# Patient Record
Sex: Female | Born: 1965
Health system: Southern US, Community
[De-identification: ages and names within clinical notes are randomized; demographics above are authoritative.]

## PROBLEM LIST (undated history)

## (undated) DIAGNOSIS — D259 Leiomyoma of uterus, unspecified: Secondary | ICD-10-CM

## (undated) DIAGNOSIS — S199XXA Unspecified injury of neck, initial encounter: Secondary | ICD-10-CM

## (undated) DIAGNOSIS — S129XXA Fracture of neck, unspecified, initial encounter: Secondary | ICD-10-CM

## (undated) DIAGNOSIS — I10 Essential (primary) hypertension: Secondary | ICD-10-CM

## (undated) DIAGNOSIS — IMO0001 Reserved for inherently not codable concepts without codable children: Secondary | ICD-10-CM

## (undated) DIAGNOSIS — D500 Iron deficiency anemia secondary to blood loss (chronic): Secondary | ICD-10-CM

## (undated) HISTORY — DX: Leiomyoma of uterus, unspecified: D25.9

## (undated) HISTORY — DX: Reserved for inherently not codable concepts without codable children: IMO0001

## (undated) HISTORY — PX: UTERINE FIBROID SURGERY: SHX826

## (undated) HISTORY — DX: Essential (primary) hypertension: I10

## (undated) HISTORY — DX: Unspecified injury of neck, initial encounter: S19.9XXA

## (undated) HISTORY — DX: Fracture of neck, unspecified, initial encounter: S12.9XXA

## (undated) HISTORY — DX: Iron deficiency anemia secondary to blood loss (chronic): D50.0

---

## 2010-01-07 ENCOUNTER — Other Ambulatory Visit: Admission: RE | Admit: 2010-01-07 | Discharge: 2010-01-07 | Payer: Self-pay | Admitting: Obstetrics and Gynecology

## 2010-02-27 ENCOUNTER — Encounter: Admission: RE | Admit: 2010-02-27 | Discharge: 2010-02-27 | Payer: Self-pay | Admitting: Family Medicine

## 2011-01-30 ENCOUNTER — Other Ambulatory Visit: Payer: Self-pay | Admitting: Family Medicine

## 2011-01-30 DIAGNOSIS — Z1231 Encounter for screening mammogram for malignant neoplasm of breast: Secondary | ICD-10-CM

## 2011-03-03 ENCOUNTER — Ambulatory Visit
Admission: RE | Admit: 2011-03-03 | Discharge: 2011-03-03 | Disposition: A | Discharge status: Home / Self Care | Payer: BC Managed Care – PPO | Source: Ambulatory Visit | Attending: Family Medicine | Admitting: Family Medicine

## 2011-03-03 DIAGNOSIS — Z1231 Encounter for screening mammogram for malignant neoplasm of breast: Secondary | ICD-10-CM

## 2011-05-31 ENCOUNTER — Emergency Department (HOSPITAL_COMMUNITY): Payer: BC Managed Care – PPO

## 2011-05-31 ENCOUNTER — Encounter (HOSPITAL_COMMUNITY): Payer: Self-pay | Admitting: *Deleted

## 2011-05-31 ENCOUNTER — Emergency Department (HOSPITAL_COMMUNITY)
Admission: EM | Admit: 2011-05-31 | Discharge: 2011-05-31 | Disposition: A | Discharge status: Home / Self Care | Payer: Self-pay | Attending: Emergency Medicine | Admitting: Emergency Medicine

## 2011-05-31 ENCOUNTER — Inpatient Hospital Stay (HOSPITAL_COMMUNITY)
Admission: EM | Admit: 2011-05-31 | Discharge: 2011-06-05 | DRG: 865 | Disposition: A | Discharge status: Home Health | Payer: BC Managed Care – PPO | Attending: Neurosurgery | Admitting: Neurosurgery

## 2011-05-31 DIAGNOSIS — S12600A Unspecified displaced fracture of seventh cervical vertebra, initial encounter for closed fracture: Secondary | ICD-10-CM

## 2011-05-31 DIAGNOSIS — S01501A Unspecified open wound of lip, initial encounter: Secondary | ICD-10-CM | POA: Insufficient documentation

## 2011-05-31 DIAGNOSIS — W010XXA Fall on same level from slipping, tripping and stumbling without subsequent striking against object, initial encounter: Secondary | ICD-10-CM | POA: Diagnosis present

## 2011-05-31 DIAGNOSIS — S129XXA Fracture of neck, unspecified, initial encounter: Secondary | ICD-10-CM

## 2011-05-31 DIAGNOSIS — W19XXXA Unspecified fall, initial encounter: Secondary | ICD-10-CM | POA: Insufficient documentation

## 2011-05-31 DIAGNOSIS — Y9301 Activity, walking, marching and hiking: Secondary | ICD-10-CM

## 2011-05-31 DIAGNOSIS — I10 Essential (primary) hypertension: Secondary | ICD-10-CM | POA: Diagnosis present

## 2011-05-31 DIAGNOSIS — S12500A Unspecified displaced fracture of sixth cervical vertebra, initial encounter for closed fracture: Principal | ICD-10-CM | POA: Diagnosis present

## 2011-05-31 DIAGNOSIS — Y9229 Other specified public building as the place of occurrence of the external cause: Secondary | ICD-10-CM

## 2011-05-31 DIAGNOSIS — M25519 Pain in unspecified shoulder: Secondary | ICD-10-CM | POA: Insufficient documentation

## 2011-05-31 HISTORY — DX: Fracture of neck, unspecified, initial encounter: S12.9XXA

## 2011-05-31 LAB — POCT I-STAT, CHEM 8
Calcium, Ion: 1.15 mmol/L (ref 1.12–1.32)
Creatinine, Ser: 0.8 mg/dL (ref 0.50–1.10)
Glucose, Bld: 94 mg/dL (ref 70–99)
HCT: 44 % (ref 36.0–46.0)
Hemoglobin: 15 g/dL (ref 12.0–15.0)

## 2011-05-31 LAB — CBC
HCT: 41 % (ref 36.0–46.0)
Hemoglobin: 14 g/dL (ref 12.0–15.0)
MCH: 28.7 pg (ref 26.0–34.0)
MCHC: 34.1 g/dL (ref 30.0–36.0)
MCV: 84 fL (ref 78.0–100.0)
RBC: 4.88 MIL/uL (ref 3.87–5.11)

## 2011-05-31 LAB — DIFFERENTIAL
Basophils Relative: 0 % (ref 0–1)
Eosinophils Absolute: 0.1 10*3/uL (ref 0.0–0.7)
Lymphs Abs: 1.2 10*3/uL (ref 0.7–4.0)
Monocytes Absolute: 0.3 10*3/uL (ref 0.1–1.0)
Monocytes Relative: 4 % (ref 3–12)

## 2011-05-31 LAB — MRSA PCR SCREENING: MRSA by PCR: NEGATIVE

## 2011-05-31 MED ORDER — ACETAMINOPHEN 325 MG PO TABS
650.0000 mg | ORAL_TABLET | Freq: Four times a day (QID) | ORAL | Status: DC | PRN
  Filled 2011-05-31: qty 2

## 2011-05-31 MED ORDER — MORPHINE SULFATE 2 MG/ML IJ SOLN
2.0000 mg | INTRAMUSCULAR | Status: DC | PRN
  Administered 2011-05-31 – 2011-06-01 (×4): 2 mg via INTRAVENOUS
  Filled 2011-05-31 (×4): qty 1

## 2011-05-31 MED ORDER — POTASSIUM CHLORIDE IN NACL 20-0.9 MEQ/L-% IV SOLN
INTRAVENOUS | Status: DC
  Administered 2011-05-31 – 2011-06-02 (×4): via INTRAVENOUS
  Filled 2011-05-31 (×8): qty 1000

## 2011-05-31 MED ORDER — HYDROMORPHONE HCL PF 1 MG/ML IJ SOLN
1.0000 mg | Freq: Once | INTRAMUSCULAR | Status: AC
  Administered 2011-05-31: 1 mg via INTRAMUSCULAR
  Filled 2011-05-31: qty 1

## 2011-05-31 MED ORDER — HYDROMORPHONE HCL PF 1 MG/ML IJ SOLN
1.0000 mg | INTRAMUSCULAR | Status: DC | PRN
  Administered 2011-05-31 – 2011-06-01 (×3): 1 mg via INTRAVENOUS
  Filled 2011-05-31 (×3): qty 1

## 2011-05-31 MED ORDER — ONDANSETRON HCL 4 MG/2ML IJ SOLN
4.0000 mg | Freq: Three times a day (TID) | INTRAMUSCULAR | Status: DC | PRN
  Administered 2011-05-31: 4 mg via INTRAVENOUS
  Filled 2011-05-31: qty 2

## 2011-05-31 MED ORDER — CEFAZOLIN SODIUM 1-5 GM-% IV SOLN
1.0000 g | Freq: Once | INTRAVENOUS | Status: DC
  Filled 2011-05-31 (×3): qty 50

## 2011-05-31 MED ORDER — ACETAMINOPHEN 650 MG RE SUPP: 650.0000 mg | Freq: Four times a day (QID) | RECTAL | Status: DC | PRN

## 2011-05-31 MED ORDER — ONDANSETRON HCL 4 MG/2ML IJ SOLN: 4.0000 mg | Freq: Four times a day (QID) | INTRAMUSCULAR | Status: DC | PRN

## 2011-05-31 MED ORDER — HYDROCODONE-ACETAMINOPHEN 5-325 MG PO TABS
1.0000 | ORAL_TABLET | Freq: Once | ORAL | Status: AC
  Administered 2011-05-31: 1 via ORAL
  Filled 2011-05-31: qty 1

## 2011-05-31 MED ORDER — ZOLPIDEM TARTRATE 5 MG PO TABS
5.0000 mg | ORAL_TABLET | Freq: Every evening | ORAL | Status: DC | PRN
  Administered 2011-05-31 – 2011-06-03 (×2): 5 mg via ORAL
  Filled 2011-05-31 (×2): qty 1

## 2011-05-31 NOTE — ED Notes (Signed)
Patient tripped and fell onto the concrete and rolled.  Patient c/o right hand, left hand, right shoulder pain, mostly on the right hand.  Patient has laceration to her bottom lip.  Denies LOC.  Alert and oriented x 3. Was able to bear weight on both legs

## 2011-05-31 NOTE — ED Notes (Signed)
Patient back from radiology.

## 2011-05-31 NOTE — ED Notes (Signed)
Bed:WA18<BR> Expected date:<BR> Expected time:<BR> Means of arrival:<BR> Comments:<BR>

## 2011-05-31 NOTE — ED Notes (Signed)
MD at bedside. 

## 2011-05-31 NOTE — H&P (Signed)
BP 143/80  Pulse 88  Temp(Src) 99.7 F (37.6 C) (Oral)  Resp 18  Ht 5\' 10"  (1.778 m)  Wt 114.9 kg (253 lb 4.9 oz)  BMI 36.35 kg/m2  SpO2 96%  LMP 05/26/2011 CC: Cervical Fracture HPI:46 yo woman whom while leaving a laundromat today tripped and fell headfirst into the side of her car then the ground. She felt pain bilaterally in her hands and arms.She denied a loss of consciousness. She was brought to the [facility] ER for evaluation. I received a report that she was neurologically normal, except for her hand pain. Mrs. Rhine underwent a cervical spine CT which revealed a jumped facet on the right at C6/7. There is significant foraminal compromise on the right at C6/7 as a result of the fracture of the C7 facet on the right.  History reviewed. No pertinent past medical history. History reviewed. No pertinent past surgical history. No Known Allergies History reviewed. No pertinent family history. History   Social History  . Marital Status: Married    Spouse Name: N/A    Number of Children: N/A  . Years of Education: N/A   Occupational History  . Not on file.   Social History Main Topics  . Smoking status: Never Smoker   . Smokeless tobacco: Not on file  . Alcohol Use: No  . Drug Use:   . Sexually Active:    Other Topics Concern  . Not on file   Social History Narrative  . No narrative on file   Prior to Admission medications   Not on File   Physical exam: Mrs. Akre is alert, oriented x 4, speech is clear and fluent. Peerl, full eom. Symmetric facies, symmetric facial sensation. Hearing intact to finger rub. Uvula elevates in the midline, shoulder shrug is normal, tongue protrudes in the midline. 5/5 strength in the deltoids, biceps, Ltriceps,L wrist extensors, intrinsics, lower extremities bilaterally. R triceps 4/5, wrist extensors 4/5, intrinsics 5/5 Normal muscle tone, bulk, and coordination. No clonus, no hoffman's. Toes down going to plantar  stimulation. Propioception nml Very tender to touch in both hands No cervical masses or bruits Lungs clear, heart regular rhythm and rate abd soft not tender, +bowel sounds No clubbing cyanosis, or edema in the extremities. A/P OR tomorrow for ORIF of jumped facet at C6/7. I would expect it to help with the triceps strength on the right. The hand pain seems dysethetic will try neurontin post op. Risks and benefits including paralysis, infection, bleeding, no improvement, weakness, bowel/bladder dysfunction,fusion failure, hardware failure,need for future surgery were discussed. I have recommended the surgery, and she has agreed.

## 2011-05-31 NOTE — ED Notes (Signed)
Patient transferred to [facility] via [facility]

## 2011-05-31 NOTE — ED Notes (Signed)
Patient fell while walking and states that she hit her head on the car then fell onto the floor.  Denies LOC.  C/o bilateral arm pain, has a small laceration to her lower lip (no active bleeding at this time), abrasions to her feet.  Alert and oriented x 3.  Brought by [employer]

## 2011-05-31 NOTE — ED Provider Notes (Signed)
History     CSN: [id number]  Arrival date & time 05/31/11  1137   First MD Initiated Contact with Patient 05/31/11 1215      1:03 PM HPI Patient  reports she tripped over a bump in the road. States she fell and hit her head. Initially was having head pain and neck pain but now reports those have improved and is having bilateral hand pain patient is supple laceration to equal mucosa of lower lip. Denies loss of consciousness, chest pain, abdominal pain, shortness of breath. Discussed with patient and states patient is behaving normally. Patient does not recall how she injured her hands.  Patient is a 46 y.o. female presenting with fall. The history is provided by the patient.  Fall The fall occurred while walking. She landed on concrete. The volume of blood lost was minimal. The point of impact was the head. The pain is present in the head (neck, and bilateral hands). The pain is at a severity of 9/10. The pain is moderate. There was no entrapment after the fall. There was no alcohol use involved in the accident. Associated symptoms include headaches. Pertinent negatives include no visual change, no numbness, no abdominal pain, no bowel incontinence, no nausea, no vomiting, no loss of consciousness and no tingling. Treatment on scene includes a c-collar and a backboard.    History reviewed. No pertinent past medical history.  History reviewed. No pertinent past surgical history.  History reviewed. No pertinent family history.  History  Substance Use Topics  . Smoking status: Never Smoker   . Smokeless tobacco: Not on file  . Alcohol Use: No    OB History    Grav Para Term Preterm Abortions TAB SAB Ect Mult Living                  Review of Systems  HENT: Positive for neck pain.   Gastrointestinal: Negative for nausea, vomiting, abdominal pain and bowel incontinence.  Musculoskeletal:       Hand pain  Skin: Positive for wound.  Neurological: Positive for headaches. Negative for  tingling, loss of consciousness and numbness.  All other systems reviewed and are negative.    Allergies  Review of patient's allergies indicates no known allergies.  Home Medications  No current outpatient prescriptions on file.  BP 138/78  Pulse 77  Temp(Src) 98.4 F (36.9 C) (Oral)  Resp 18  SpO2 100%  LMP 05/26/2011  Physical Exam  Vitals reviewed. Constitutional: She is oriented to person, place, and time. Vital signs are normal. She appears well-developed and well-nourished.  HENT:  Head: Normocephalic and atraumatic.  Eyes: Conjunctivae are normal. Pupils are equal, round, and reactive to light.  Neck: Normal range of motion. Neck supple.  Cardiovascular: Normal rate, regular rhythm and normal heart sounds.  Exam reveals no friction rub.   No murmur heard. Pulmonary/Chest: Effort normal and breath sounds normal. She has no wheezes. She has no rhonchi. She has no rales. She exhibits no tenderness.  Musculoskeletal:       Cervical back: She exhibits decreased range of motion (immobilized in C-collar ). She exhibits no bony tenderness and normal pulse.       Thoracic back: Normal.       Lumbar back: Normal.       Patient has full range of motion bilaterally in hands. Tender to palpation over dorsal portion of the hands. Normal capillary refill. Normal sensation. Decreased range of motion. Patient has muscle tenderness and cramping. Decreased range  of motion but patient states this is due to severe pain in hands.  Neurological: She is alert and oriented to person, place, and time. Coordination normal.  Skin: Skin is warm and dry. No rash noted. No erythema. No pallor.    ED Course  Procedures   No results found for this or any previous visit. Ct Head Wo Contrast  05/31/2011  *RADIOLOGY REPORT*  Clinical Data:  Status post fall with head injury and facial laceration.  CT HEAD WITHOUT CONTRAST CT CERVICAL SPINE WITHOUT CONTRAST  Technique:  Multidetector CT imaging of the  head and cervical spine was performed following the standard protocol without intravenous contrast.  Multiplanar CT image reconstructions of the cervical spine were also generated.  Comparison:   None  CT HEAD  Findings: There is no evidence of acute intracranial hemorrhage, mass lesion, brain edema or extra-axial fluid collection.  The ventricles and subarachnoid spaces are appropriately sized for age. There is no CT evidence of acute cortical infarction.  The visualized paranasal sinuses are clear. The calvarium is intact.  There may be mild scalp swelling at the left parietal vertex.  IMPRESSION: No acute intracranial or calvarial findings.  CT CERVICAL SPINE  Findings: There is a mildly displaced intra-articular fracture involving the right C7 articulating facet.  This extends into the right C6-C7 facet joint and results in moderate right foraminal stenosis at C6-C7.  There is 3 mm of anterior subluxation of C6 on C7 with anterior disc space narrowing.  The left facet joint appears mildly widened but intact.  No other fractures are identified.  There is reversal of the usual cervical lordosis with mild multilevel spondylosis.  IMPRESSION:  1.  Intra-articular mildly displaced fracture of the right C7 articulating facet is associated with mild anterolisthesis at C6-C7 and mild widening of the left facet joint.  This may represent an unstable injury. 2.  No other acute osseous findings. 3.  Mild multilevel spondylosis.  Critical Value/emergent results were called by telephone at the time of interpretation on 05/31/2011  at 1315 hours  to  Thomasene Lot, who verbally acknowledged these results.  Original Report Authenticated By: Gerrianne Scale, M.D.   Ct Cervical Spine Wo Contrast  05/31/2011  *RADIOLOGY REPORT*  Clinical Data:  Status post fall with head injury and facial laceration.  CT HEAD WITHOUT CONTRAST CT CERVICAL SPINE WITHOUT CONTRAST  Technique:  Multidetector CT imaging of the head and cervical  spine was performed following the standard protocol without intravenous contrast.  Multiplanar CT image reconstructions of the cervical spine were also generated.  Comparison:   None  CT HEAD  Findings: There is no evidence of acute intracranial hemorrhage, mass lesion, brain edema or extra-axial fluid collection.  The ventricles and subarachnoid spaces are appropriately sized for age. There is no CT evidence of acute cortical infarction.  The visualized paranasal sinuses are clear. The calvarium is intact.  There may be mild scalp swelling at the left parietal vertex.  IMPRESSION: No acute intracranial or calvarial findings.  CT CERVICAL SPINE  Findings: There is a mildly displaced intra-articular fracture involving the right C7 articulating facet.  This extends into the right C6-C7 facet joint and results in moderate right foraminal stenosis at C6-C7.  There is 3 mm of anterior subluxation of C6 on C7 with anterior disc space narrowing.  The left facet joint appears mildly widened but intact.  No other fractures are identified.  There is reversal of the usual cervical lordosis with mild multilevel  spondylosis.  IMPRESSION:  1.  Intra-articular mildly displaced fracture of the right C7 articulating facet is associated with mild anterolisthesis at C6-C7 and mild widening of the left facet joint.  This may represent an unstable injury. 2.  No other acute osseous findings. 3.  Mild multilevel spondylosis.  Critical Value/emergent results were called by telephone at the time of interpretation on 05/31/2011  at 1315 hours  to  Thomasene Lot, who verbally acknowledged these results.  Original Report Authenticated By: Gerrianne Scale, M.D.     MDM  Discussed plan with Dr. Jeraldine Loots, who has seen and evaluated the patient with me. Consult with neurosurgery who will admit/Transfer patient to [facility] cone. Family and patient aware of this. Patient placed in hard collar       Thomasene Lot, PA-C 05/31/11  1415

## 2011-06-01 ENCOUNTER — Inpatient Hospital Stay (HOSPITAL_COMMUNITY): Payer: BC Managed Care – PPO

## 2011-06-01 ENCOUNTER — Encounter (HOSPITAL_COMMUNITY): Payer: Self-pay | Admitting: Certified Registered"

## 2011-06-01 ENCOUNTER — Encounter (HOSPITAL_COMMUNITY)
Admission: EM | Disposition: A | Discharge status: Home Health | Payer: Self-pay | Source: Home / Self Care | Attending: Neurosurgery

## 2011-06-01 ENCOUNTER — Inpatient Hospital Stay (HOSPITAL_COMMUNITY): Payer: BC Managed Care – PPO | Admitting: Certified Registered"

## 2011-06-01 HISTORY — PX: POSTERIOR CERVICAL FUSION/FORAMINOTOMY: SHX5038

## 2011-06-01 LAB — URINALYSIS, ROUTINE W REFLEX MICROSCOPIC
Nitrite: NEGATIVE
Specific Gravity, Urine: 1.027 (ref 1.005–1.030)
Urobilinogen, UA: 0.2 mg/dL (ref 0.0–1.0)

## 2011-06-01 LAB — URINE MICROSCOPIC-ADD ON

## 2011-06-01 SURGERY — POSTERIOR CERVICAL FUSION/FORAMINOTOMY LEVEL 1
Anesthesia: General | Site: Spine Cervical | Wound class: Clean

## 2011-06-01 MED ORDER — PROMETHAZINE HCL 25 MG/ML IJ SOLN: 6.2500 mg | INTRAMUSCULAR | Status: DC | PRN

## 2011-06-01 MED ORDER — ACETAMINOPHEN 650 MG RE SUPP: 650.0000 mg | RECTAL | Status: DC | PRN

## 2011-06-01 MED ORDER — OXYCODONE-ACETAMINOPHEN 5-325 MG PO TABS
1.0000 | ORAL_TABLET | ORAL | Status: DC | PRN
Start: 2011-06-01 — End: 2011-06-06
  Administered 2011-06-01: 1 via ORAL
  Administered 2011-06-02: 2 via ORAL
  Administered 2011-06-03: 1 via ORAL
  Administered 2011-06-03 – 2011-06-04 (×4): 2 via ORAL
  Administered 2011-06-04: 1 via ORAL
  Administered 2011-06-04: 2 via ORAL
  Filled 2011-06-01 (×4): qty 2
  Filled 2011-06-01: qty 1
  Filled 2011-06-01 (×3): qty 2

## 2011-06-01 MED ORDER — MORPHINE SULFATE 4 MG/ML IJ SOLN
1.0000 mg | INTRAMUSCULAR | Status: DC | PRN
  Administered 2011-06-01: 2 mg via INTRAVENOUS
  Filled 2011-06-01: qty 1

## 2011-06-01 MED ORDER — 0.9 % SODIUM CHLORIDE (POUR BTL) OPTIME
TOPICAL | Status: DC | PRN
  Administered 2011-06-01: 1000 mL

## 2011-06-01 MED ORDER — SODIUM CHLORIDE 0.9 % IJ SOLN
3.0000 mL | INTRAMUSCULAR | Status: DC | PRN
  Administered 2011-06-01: 3 mL via INTRAVENOUS

## 2011-06-01 MED ORDER — MENTHOL 3 MG MT LOZG: 1.0000 | LOZENGE | OROMUCOSAL | Status: DC | PRN

## 2011-06-01 MED ORDER — ONDANSETRON HCL 4 MG/2ML IJ SOLN: 4.0000 mg | INTRAMUSCULAR | Status: DC | PRN

## 2011-06-01 MED ORDER — BACITRACIN ZINC 500 UNIT/GM EX OINT
TOPICAL_OINTMENT | CUTANEOUS | Status: DC | PRN
  Administered 2011-06-01: 1 via TOPICAL

## 2011-06-01 MED ORDER — THROMBIN 20000 UNITS EX KIT
PACK | CUTANEOUS | Status: DC | PRN
  Administered 2011-06-01: 13:00:00 via TOPICAL

## 2011-06-01 MED ORDER — SODIUM CHLORIDE 0.9 % IJ SOLN
3.0000 mL | Freq: Two times a day (BID) | INTRAMUSCULAR | Status: DC
  Administered 2011-06-01: 22:00:00 via INTRAVENOUS

## 2011-06-01 MED ORDER — DEXAMETHASONE SODIUM PHOSPHATE 4 MG/ML IJ SOLN
4.0000 mg | Freq: Four times a day (QID) | INTRAMUSCULAR | Status: AC
  Administered 2011-06-01 – 2011-06-02 (×3): 4 mg via INTRAVENOUS
  Filled 2011-06-01 (×2): qty 1
  Filled 2011-06-01: qty 2
  Filled 2011-06-01 (×2): qty 1

## 2011-06-01 MED ORDER — LIDOCAINE-EPINEPHRINE 1 %-1:100000 IJ SOLN
INTRAMUSCULAR | Status: DC | PRN
  Administered 2011-06-01: 8 mL

## 2011-06-01 MED ORDER — HYDROMORPHONE HCL PF 1 MG/ML IJ SOLN: 0.2500 mg | INTRAMUSCULAR | Status: DC | PRN

## 2011-06-01 MED ORDER — CEFAZOLIN SODIUM 1-5 GM-% IV SOLN
1.0000 g | Freq: Three times a day (TID) | INTRAVENOUS | Status: AC
  Administered 2011-06-01 (×2): 1 g via INTRAVENOUS
  Filled 2011-06-01 (×2): qty 50

## 2011-06-01 MED ORDER — SODIUM CHLORIDE 0.9 % IV SOLN: 250.0000 mL | INTRAVENOUS | Status: DC

## 2011-06-01 MED ORDER — HEMOSTATIC AGENTS (NO CHARGE) OPTIME: TOPICAL | Status: DC | PRN

## 2011-06-01 MED ORDER — CYCLOBENZAPRINE HCL 10 MG PO TABS
10.0000 mg | ORAL_TABLET | Freq: Three times a day (TID) | ORAL | Status: DC | PRN
  Administered 2011-06-02 – 2011-06-04 (×5): 10 mg via ORAL
  Filled 2011-06-01 (×6): qty 1

## 2011-06-01 MED ORDER — SENNA 8.6 MG PO TABS
1.0000 | ORAL_TABLET | Freq: Two times a day (BID) | ORAL | Status: DC
  Administered 2011-06-01 – 2011-06-04 (×7): 8.6 mg via ORAL
  Filled 2011-06-01 (×10): qty 1

## 2011-06-01 MED ORDER — HYDROCODONE-ACETAMINOPHEN 5-325 MG PO TABS
1.0000 | ORAL_TABLET | ORAL | Status: DC | PRN
  Administered 2011-06-01: 1 via ORAL
  Administered 2011-06-02 – 2011-06-04 (×2): 2 via ORAL
  Filled 2011-06-01: qty 1
  Filled 2011-06-01 (×2): qty 2

## 2011-06-01 MED ORDER — PHENOL 1.4 % MT LIQD: 1.0000 | OROMUCOSAL | Status: DC | PRN

## 2011-06-01 MED ORDER — DEXAMETHASONE 4 MG PO TABS
4.0000 mg | ORAL_TABLET | Freq: Four times a day (QID) | ORAL | Status: AC
  Administered 2011-06-02 – 2011-06-03 (×3): 4 mg via ORAL
  Filled 2011-06-01 (×3): qty 1

## 2011-06-01 MED ORDER — ACETAMINOPHEN 325 MG PO TABS
650.0000 mg | ORAL_TABLET | ORAL | Status: DC | PRN
  Administered 2011-06-02 (×2): 650 mg via ORAL
  Filled 2011-06-01: qty 2

## 2011-06-01 MED ORDER — ALUM & MAG HYDROXIDE-SIMETH 200-200-20 MG/5ML PO SUSP: 30.0000 mL | Freq: Four times a day (QID) | ORAL | Status: DC | PRN

## 2011-06-01 MED ORDER — MEPERIDINE HCL 25 MG/ML IJ SOLN: 6.2500 mg | INTRAMUSCULAR | Status: DC | PRN

## 2011-06-01 MED ORDER — THROMBIN 5000 UNITS EX KIT: PACK | CUTANEOUS | Status: DC | PRN

## 2011-06-01 SURGICAL SUPPLY — 69 items
BAG DECANTER FOR FLEXI CONT (MISCELLANEOUS) ×2 IMPLANT
BENZOIN TINCTURE PRP APPL 2/3 (GAUZE/BANDAGES/DRESSINGS) IMPLANT
BIT DRILL 2.4X (BIT) ×1 IMPLANT
BIT DRL 2.4X (BIT) ×1
BLADE SURG ROTATE 9660 (MISCELLANEOUS) IMPLANT
BLADE ULTRA TIP 2M (BLADE) IMPLANT
BUR MATCHSTICK NEURO 3.0 LAGG (BURR) ×2 IMPLANT
CABLE SNG STERILE W/CRIMP (Neuro Prosthesis/Implant) ×2 IMPLANT
CANISTER SUCTION 2500CC (MISCELLANEOUS) ×2 IMPLANT
CLOTH BEACON ORANGE TIMEOUT ST (SAFETY) ×2 IMPLANT
CONT SPEC 4OZ CLIKSEAL STRL BL (MISCELLANEOUS) ×2 IMPLANT
DECANTER SPIKE VIAL GLASS SM (MISCELLANEOUS) IMPLANT
DERMABOND ADHESIVE PROPEN (GAUZE/BANDAGES/DRESSINGS) ×2
DERMABOND ADVANCED .7 DNX6 (GAUZE/BANDAGES/DRESSINGS) ×2 IMPLANT
DRAPE C-ARM 42X72 X-RAY (DRAPES) ×4 IMPLANT
DRAPE LAPAROTOMY 100X72 PEDS (DRAPES) ×2 IMPLANT
DRAPE MICROSCOPE LEICA (MISCELLANEOUS) ×2 IMPLANT
DRAPE POUCH INSTRU U-SHP 10X18 (DRAPES) ×2 IMPLANT
DRESSING TELFA 8X3 (GAUZE/BANDAGES/DRESSINGS) IMPLANT
DRILL BIT (BIT) ×1
DURAFORM COLLAGEN 1X1 5-PACK (Neuro Prosthesis/Implant) ×2 IMPLANT
DURAPREP 26ML APPLICATOR (WOUND CARE) ×2 IMPLANT
DURAPREP 6ML APPLICATOR 50/CS (WOUND CARE) IMPLANT
DURASEAL SPINE SEALANT 3ML (MISCELLANEOUS) ×2 IMPLANT
ELECT REM PT RETURN 9FT ADLT (ELECTROSURGICAL) ×2
ELECTRODE REM PT RTRN 9FT ADLT (ELECTROSURGICAL) ×1 IMPLANT
GAUZE SPONGE 4X4 16PLY XRAY LF (GAUZE/BANDAGES/DRESSINGS) IMPLANT
GLOVE BIOGEL PI IND STRL 7.0 (GLOVE) ×2 IMPLANT
GLOVE BIOGEL PI INDICATOR 7.0 (GLOVE) ×2
GLOVE ECLIPSE 6.5 STRL STRAW (GLOVE) ×2 IMPLANT
GLOVE EXAM NITRILE LRG STRL (GLOVE) IMPLANT
GLOVE EXAM NITRILE MD LF STRL (GLOVE) IMPLANT
GLOVE EXAM NITRILE XL STR (GLOVE) IMPLANT
GLOVE EXAM NITRILE XS STR PU (GLOVE) IMPLANT
GLOVE SS BIOGEL STRL SZ 6.5 (GLOVE) ×2 IMPLANT
GLOVE SUPERSENSE BIOGEL SZ 6.5 (GLOVE) ×2
GLOVE SURG SS PI 6.5 STRL IVOR (GLOVE) ×2 IMPLANT
GOWN BRE IMP SLV AUR LG STRL (GOWN DISPOSABLE) ×6 IMPLANT
GOWN BRE IMP SLV AUR XL STRL (GOWN DISPOSABLE) IMPLANT
GOWN STRL REIN 2XL LVL4 (GOWN DISPOSABLE) IMPLANT
KIT BASIN OR (CUSTOM PROCEDURE TRAY) ×2 IMPLANT
KIT ROOM TURNOVER OR (KITS) ×2 IMPLANT
MARKER SKIN DUAL TIP RULER LAB (MISCELLANEOUS) ×2 IMPLANT
NEEDLE HYPO 25X1 1.5 SAFETY (NEEDLE) ×2 IMPLANT
NS IRRIG 1000ML POUR BTL (IV SOLUTION) ×2 IMPLANT
PACK LAMINECTOMY NEURO (CUSTOM PROCEDURE TRAY) ×2 IMPLANT
PAD ARMBOARD 7.5X6 YLW CONV (MISCELLANEOUS) ×6 IMPLANT
PIN MAYFIELD SKULL DISP (PIN) ×2 IMPLANT
ROD T 3.5MM (Rod) ×2 IMPLANT
RUBBERBAND STERILE (MISCELLANEOUS) ×4 IMPLANT
SCREW 3.5X10 (Screw) ×2 IMPLANT
SCREW CANCELLOUS 3.5X14MM (Screw) ×2 IMPLANT
SCREW SET M6 (Screw) ×4 IMPLANT
SPONGE GAUZE 4X4 12PLY (GAUZE/BANDAGES/DRESSINGS) IMPLANT
SPONGE LAP 4X18 X RAY DECT (DISPOSABLE) IMPLANT
SPONGE SURGIFOAM ABS GEL 100 (HEMOSTASIS) ×2 IMPLANT
STAPLER SKIN PROX WIDE 3.9 (STAPLE) ×2 IMPLANT
STRIP CLOSURE SKIN 1/2X4 (GAUZE/BANDAGES/DRESSINGS) IMPLANT
SUT ETHILON 3 0 FSL (SUTURE) IMPLANT
SUT VIC AB 0 CT1 18XCR BRD8 (SUTURE) ×1 IMPLANT
SUT VIC AB 0 CT1 8-18 (SUTURE) ×1
SUT VIC AB 2-0 CP2 18 (SUTURE) ×2 IMPLANT
SUT VIC AB 2-0 CT1 18 (SUTURE) ×2 IMPLANT
SUT VIC AB 3-0 SH 8-18 (SUTURE) ×4 IMPLANT
SYR 20ML ECCENTRIC (SYRINGE) ×2 IMPLANT
TOWEL OR 17X24 6PK STRL BLUE (TOWEL DISPOSABLE) ×2 IMPLANT
TOWEL OR 17X26 10 PK STRL BLUE (TOWEL DISPOSABLE) ×2 IMPLANT
UNDERPAD 30X30 INCONTINENT (UNDERPADS AND DIAPERS) IMPLANT
WATER STERILE IRR 1000ML POUR (IV SOLUTION) ×2 IMPLANT

## 2011-06-01 NOTE — Plan of Care (Signed)
Problem: Phase I Progression Outcomes Goal: Pain controlled with appropriate interventions Outcome: Not Progressing Pt has pain on her hands following an injury after a fall. Pain meds do little at this time but she is scheduled for surgery to manage C7 fracture. Goal: Hemodynamically stable Outcome: Progressing VSS

## 2011-06-01 NOTE — Anesthesia Preprocedure Evaluation (Addendum)
Anesthesia Evaluation  Patient identified by MRN, date of birth, ID band Patient awake    Reviewed: Allergy & Precautions, H&P , NPO status , Patient's Chart, lab work & pertinent test results  Airway Mallampati: II TM Distance: <3 FB Neck ROM: Limited    Dental  (+) Teeth Intact   Pulmonary neg pulmonary ROS,  breath sounds clear to auscultation        Cardiovascular Exercise Tolerance: Good negative cardio ROS  Rhythm:Regular Rate:Normal     Neuro/Psych negative neurological ROS     GI/Hepatic negative GI ROS, Neg liver ROS,   Endo/Other    Renal/GU negative Renal ROS  negative genitourinary   Musculoskeletal negative musculoskeletal ROS (+)   Abdominal (+)  Abdomen: soft.    Peds negative pediatric ROS (+)  Hematology negative hematology ROS (+)   Anesthesia Other Findings   Reproductive/Obstetrics negative OB ROS                         Anesthesia Physical Anesthesia Plan  ASA: I and Emergent  Anesthesia Plan: General   Post-op Pain Management:    Induction: Intravenous  Airway Management Planned: Oral ETT  Additional Equipment:   Intra-op Plan:   Post-operative Plan: Extubation in OR  Informed Consent:   Plan Discussed with: Surgeon and CRNA  Anesthesia Plan Comments:         Anesthesia Quick Evaluation

## 2011-06-01 NOTE — Anesthesia Postprocedure Evaluation (Signed)
  Anesthesia Post-op Note  Patient: Olivia West  Procedure(s) Performed: Procedure(s) (LRB): POSTERIOR CERVICAL FUSION/FORAMINOTOMY LEVEL 1 (N/A)  Patient Location: NICU  Anesthesia Type: General  Level of Consciousness: awake  Airway and Oxygen Therapy: Patient Spontanous Breathing  Post-op Pain: mild  Post-op Assessment: Post-op Vital signs reviewed  Post-op Vital Signs: stable  Complications: No apparent anesthesia complications

## 2011-06-01 NOTE — Progress Notes (Signed)
BP 156/95  Pulse 88  Temp(Src) 99.5 F (37.5 C) (Oral)  Resp 14  Ht 5\' 10"  (1.778 m)  Wt 114.9 kg (253 lb 4.9 oz)  BMI 36.35 kg/m2  SpO2 98%  LMP 05/26/2011 Moving all extremities.  Dressing intact. Doing well

## 2011-06-01 NOTE — Op Note (Signed)
05/31/2011 - 06/01/2011  3:12 PM  PATIENT:  Olivia West  46 y.o. female  PRE-OPERATIVE DIAGNOSIS:  C-6 C-7 Facet Fracture Right C7 radiculopathy POST-OPERATIVE DIAGNOSIS:  Cervical Six-Seven Facet Fracture Right C7 radiculopathy PROCEDURE:  Procedure(s): POSTERIOR CERVICAL FUSION/FORAMINOTOMY LEVEL C6/7, local autograft Posterior instrumentation, Lateral mass screws C6,7 on the Left. Spinous process wiring C6-7 Microdissection  SURGEON:  Surgeon(s): Carmela Hurt, MD  ASSISTANTS:none  ANESTHESIA:   general  EBL:  Total I/O In: 150 [I.V.:150] Out: 1400 [Urine:1400]  BLOOD ADMINISTERED:none  CELL SAVER GIVEN:none  COUNT:per nursing  DRAINS: none   SPECIMEN:  No Specimen  DICTATION: Olivia West presented with a Unilateral jumped facet and associated fracture on the Right at C6/7. She also had weakness in the R C7 distribution, and pain in her hands. I recommended that she undergo an open reduction of the spine with internal fixation. She was brought to the operating room intubated and placed under a general anesthetic. I placed a 3 pin Mayfield headholder to ~70lbs of pressure after adequate anesthesia was obtained. With her cervical collar in place we flipped her in a controlled fashion onto the operating table. After final positioning of her body I secure her head to the bed with the Mayfield adapter.  Her neck was prepped and draped in sterile fashion. Lateral fluoroscopy did not allow me to see the C6/7 level, but AP projections did. I opened the neck with a 10 blade and used monopolar cautery to divide the posterior cervical fascia and expose the lamina of C5,6,and 7. I confirmed my location with xray and the identification of the fracture.  I placed a lateral mass screw at C6 on the Left, and a pedicle screw at C7 on the left, with fluoroscopic guidance. I did not appreciate cut outs of the bone with either screw. I then decompressed the R C7 root. I drilled away some of  the inferior facet of C6 and the superior remaining facet of C7. She had actually reduced the fracture, probably due to positioning initially. With microscopic dissection I removed the fractured portion of the C7 facet which had been driven anteriorly into the foramen. I used the drill, nerve hooks, and kerrison punches to remove the fractured bone. CSF was draining from underneath the lamina. The dura was torn by the fracture. I placed a piece of duragen over the tear then used duraseal .  I placed the songar cable around the spinous processes of C6/7and placed this to about 25lbs of pressure. I then completed the construct by placing a rod from C6 to C7 and locked into position with locking caps. Xray showed the construct in good position. I irrigated the wound then closed in layers. I used vicryl sutures for the subcutaneous layers then staples for the skin edges. I used dermabond for a sterile dressing. I with the team flipped the patient, removed the [facility], then she was extubated.  PLAN OF CARE: Admit to inpatient   PATIENT DISPOSITION:  PACU - hemodynamically stable.   Delay start of Pharmacological VTE agent (>24hrs) due to surgical blood loss or risk of bleeding:  yes

## 2011-06-01 NOTE — ED Provider Notes (Signed)
Medical screening examination/treatment/procedure(s) were conducted as a shared visit with non-physician practitioner(s) and myself.  I personally evaluated the patient during the encounter This previously well female presents following a seemingly innocuous fall with head and neck pain as well as bilateral hand discomfort.  The patient was in no distress on my exam, though she continued to be declared pain in both hands.  The patient's neurologic exam was reassuring.  However, her CT demonstrated an acute facet fracture.  I discussed the case with neurosurgery.  The patient was admitted to their service for further evaluation and management.  Gerhard Munch, MD 06/01/11 [phone]

## 2011-06-01 NOTE — Anesthesia Procedure Notes (Signed)
Procedure Name: Intubation Date/Time: 06/01/2011 11:10 AM Performed by: Ellin Goodie Pre-anesthesia Checklist: Patient identified, Emergency Drugs available, Suction available, Timeout performed and Patient being monitored Patient Re-evaluated:Patient Re-evaluated prior to inductionOxygen Delivery Method: Circle system utilized Preoxygenation: Pre-oxygenation with 100% oxygen Intubation Type: IV induction Ventilation: Mask ventilation without difficulty Laryngoscope Size: Mac and 3 Tube size: 7.5 mm Number of attempts: 1 Airway Equipment and Method: Stylet and Video-laryngoscopy Placement Confirmation: ETT inserted through vocal cords under direct vision,  positive ETCO2 and breath sounds checked- equal and bilateral Secured at: 22 cm Tube secured with: Tape Dental Injury: Teeth and Oropharynx as per pre-operative assessment

## 2011-06-02 ENCOUNTER — Inpatient Hospital Stay (HOSPITAL_COMMUNITY): Payer: BC Managed Care – PPO

## 2011-06-02 ENCOUNTER — Encounter (HOSPITAL_COMMUNITY): Payer: Self-pay | Admitting: Radiology

## 2011-06-02 NOTE — Evaluation (Signed)
Physical Therapy Evaluation Patient Details Name: Olivia West MRN: [id number] DOB: 22-May-1965 Today's Date: 06/02/2011  Problem List:  Patient Active Problem List  Diagnoses  . Fracture of spine, cervical, without spinal cord injury, closed    Past Medical History: History reviewed. No pertinent past medical history. Past Surgical History: History reviewed. No pertinent past surgical history.  PT Assessment/Plan/Recommendation PT Assessment Clinical Impression Statement: Patient presents S/P cervical fusion post traumatic fall. She presents with balance/mobility deficits that will require PTin the acute care setting to ensure that she can safely transition home with decreased burden iof care. PT Recommendation/Assessment: Patient will need skilled PT in the acute care venue PT Problem List: Decreased activity tolerance;Decreased balance;Decreased mobility;Pain;Decreased knowledge of precautions;Decreased knowledge of use of DME PT Therapy Diagnosis : Difficulty walking;Acute pain PT Plan PT Frequency: Min 5X/week PT Treatment/Interventions: DME instruction;Gait training;Stair training;Functional mobility training;Therapeutic activities;Therapeutic exercise;Balance training;Patient/family education PT Recommendation Follow Up Recommendations: Home health PT;Supervision for mobility/OOB Equipment Recommended: Rolling walker with 5" wheels;3 in 1 bedside comode PT Goals  Acute Rehab PT Goals PT Goal Formulation: With patient Time For Goal Achievement: 7 days Pt will Roll Supine to Right Side: with modified independence PT Goal: Rolling Supine to Right Side - Progress: Goal set today Pt will Roll Supine to Left Side: with modified independence PT Goal: Rolling Supine to Left Side - Progress: Goal set today Pt will go Supine/Side to Sit: with modified independence PT Goal: Supine/Side to Sit - Progress: Goal set today Pt will go Sit to Supine/Side: with modified independence PT Goal:  Sit to Supine/Side - Progress: Goal set today Pt will go Sit to Stand: with modified independence;without upper extremity assist PT Goal: Sit to Stand - Progress: Goal set today Pt will go Stand to Sit: with modified independence;with upper extremity assist PT Goal: Stand to Sit - Progress: Goal set today Pt will Ambulate: >150 feet;with modified independence;with least restrictive assistive device PT Goal: Ambulate - Progress: Goal set today Pt will Go Up / Down Stairs: Flight;with min assist;with rail(s) PT Goal: Up/Down Stairs - Progress: Goal set today  PT Evaluation Precautions/Restrictions  Precautions Precaution Comments: Supplied with handout of precautions and advice on posture/body mechanics Required Braces or Orthoses: Yes Cervical Brace: Hard collar (at all times) Prior Functioning  Home Living Lives With: Spouse Receives Help From:  (Spouse is a taxi Hospital doctor) Type of Home: Apartment Home Access: Stairs to enter Entrance Stairs-Rails: Doctor, general practice of Steps: 15 Bathroom Shower/Tub: Network engineer: None Prior Function Level of Independence: Independent with basic ADLs;Independent with homemaking with ambulation Driving: Yes 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 Cognition Cognition Arousal/Alertness: Awake/alert Overall Cognitive Status: Appears within functional limits for tasks assessed Orientation Level: Oriented X4 Sensation/Coordination Sensation Light Touch: Appears Intact Coordination Gross Motor Movements are Fluid and Coordinated: Yes Extremity Assessment RLE Assessment RLE Assessment: Within Functional Limits LLE Assessment LLE Assessment: Within Functional Limits Mobility (including Balance) Bed Mobility Bed Mobility: Yes Rolling Right: 5: Supervision Rolling Right Details (indicate cue type and reason): Good initiation of trunk without pulling with  upper extremities Right Sidelying to Sit: 4: Min assist;HOB elevated (comment degrees) Right Sidelying to Sit Details (indicate cue type and reason): 30 degrees, assistance for initiation only then patient able to complete transition using trunk muscles Sitting - Scoot to Edge of Bed: 4: Min assist Sitting - Scoot to [place] of Bed Details (indicate cue type and reason): Patient using technique to reciprocally  scoot hips forward Transfers Sit to Stand: 4: Min assist;From bed;From chair/3-in-1;Without upper extremity assist Sit to Stand Details (indicate cue type and reason): Patient required assistance to initiate and stabilize. Stand to Sit: 4: Min assist;With upper extremity assist;To chair/3-in-1 Stand to Sit Details: Assistance to control descent to limit upper extremity use. Ambulation/Gait Ambulation/Gait: Yes Ambulation/Gait Assistance: 4: Min assist Ambulation/Gait Assistance Details (indicate cue type and reason): Wide base of support and increased sway at times requiring PT to assist with balance  - recommend rolling walker next visit. Ambulation Distance (Feet): 100 Feet Assistive device: 1 person hand held assist Gait Pattern: Step-through pattern  Posture/Postural Control Posture/Postural Control: Postural limitations Postural Limitations: Difficulty grading weight shift bilaterally at times leading to over excursion over stance leg. End of Session PT - End of Session Equipment Utilized During Treatment: Gait belt Activity Tolerance: Patient tolerated treatment well;Patient limited by fatigue Patient left: in chair;with call bell in reach Nurse Communication: Mobility status for ambulation;Mobility status for transfers General Behavior During Session: [facility] for tasks performed Cognition: [facility] for tasks performed  Edwyna Perfect, PT  Pager [phone] 06/02/2011, 8:49 AM

## 2011-06-02 NOTE — Progress Notes (Signed)
Subjective: Patient reports r arm feels much better. Still with some weakness, though better  Objective: Vital signs in last 24 hours: Temp:  [98.7 F (37.1 C)-100.5 F (38.1 C)] 98.7 F (37.1 C) (03/04 0800) Pulse Rate:  [68-97] 68  (03/04 0800) Resp:  [13-22] 13  (03/04 0800) BP: (148-179)/(80-97) 156/90 mmHg (03/04 0900) SpO2:  [92 %-100 %] 100 % (03/04 0900)  Intake/Output from previous day: 03/03 0701 - 03/04 0700 In: 1225 [I.V.:1125; IV Piggyback:100] Out: 3900 [Urine:3900] Intake/Output this shift: Total I/O In: 375 [I.V.:375] Out: 350 [Urine:350]  Neurologic: Mental status: Alert, oriented, thought content appropriate Motor: Improved: right triceps grade 4  Lab Results:  Basename 05/31/11 1425 05/31/11 1413  WBC -- 9.7  HGB 15.0 14.0  HCT 44.0 41.0  PLT -- 319   BMET  Basename 05/31/11 1425  NA 139  K 4.1  CL 106  CO2 --  GLUCOSE 94  BUN 9  CREATININE 0.80  CALCIUM --    Studies/Results: Dg Cervical Spine 1 View  06/01/2011  *RADIOLOGY REPORT*  Clinical Data: Posterior cervical fusion  DG CERVICAL SPINE - 1 VIEW  Comparison: No CT 06/08/2011  Findings: A single intraoperative fluoroscopic view demonstrates posterior fusion at C6-C7 with screws and a rod laterally on the left and single screw centrally and C6.  IMPRESSION: Posterior cervical fusion as C6-C7.  Original Report Authenticated By: Genevive Bi, M.D.   Ct Head Wo Contrast  05/31/2011  *RADIOLOGY REPORT*  Clinical Data:  Status post fall with head injury and facial laceration.  CT HEAD WITHOUT CONTRAST CT CERVICAL SPINE WITHOUT CONTRAST  Technique:  Multidetector CT imaging of the head and cervical spine was performed following the standard protocol without intravenous contrast.  Multiplanar CT image reconstructions of the cervical spine were also generated.  Comparison:   None  CT HEAD  Findings: There is no evidence of acute intracranial hemorrhage, mass lesion, brain edema or extra-axial fluid  collection.  The ventricles and subarachnoid spaces are appropriately sized for age. There is no CT evidence of acute cortical infarction.  The visualized paranasal sinuses are clear. The calvarium is intact.  There may be mild scalp swelling at the left parietal vertex.  IMPRESSION: No acute intracranial or calvarial findings.  CT CERVICAL SPINE  Findings: There is a mildly displaced intra-articular fracture involving the right C7 articulating facet.  This extends into the right C6-C7 facet joint and results in moderate right foraminal stenosis at C6-C7.  There is 3 mm of anterior subluxation of C6 on C7 with anterior disc space narrowing.  The left facet joint appears mildly widened but intact.  No other fractures are identified.  There is reversal of the usual cervical lordosis with mild multilevel spondylosis.  IMPRESSION:  1.  Intra-articular mildly displaced fracture of the right C7 articulating facet is associated with mild anterolisthesis at C6-C7 and mild widening of the left facet joint.  This may represent an unstable injury. 2.  No other acute osseous findings. 3.  Mild multilevel spondylosis.  Critical Value/emergent results were called by telephone at the time of interpretation on 05/31/2011  at 1315 hours  to  Thomasene Lot, who verbally acknowledged these results.  Original Report Authenticated By: Gerrianne Scale, M.D.   Ct Cervical Spine Wo Contrast  05/31/2011  *RADIOLOGY REPORT*  Clinical Data:  Status post fall with head injury and facial laceration.  CT HEAD WITHOUT CONTRAST CT CERVICAL SPINE WITHOUT CONTRAST  Technique:  Multidetector CT imaging of the head  and cervical spine was performed following the standard protocol without intravenous contrast.  Multiplanar CT image reconstructions of the cervical spine were also generated.  Comparison:   None  CT HEAD  Findings: There is no evidence of acute intracranial hemorrhage, mass lesion, brain edema or extra-axial fluid collection.  The  ventricles and subarachnoid spaces are appropriately sized for age. There is no CT evidence of acute cortical infarction.  The visualized paranasal sinuses are clear. The calvarium is intact.  There may be mild scalp swelling at the left parietal vertex.  IMPRESSION: No acute intracranial or calvarial findings.  CT CERVICAL SPINE  Findings: There is a mildly displaced intra-articular fracture involving the right C7 articulating facet.  This extends into the right C6-C7 facet joint and results in moderate right foraminal stenosis at C6-C7.  There is 3 mm of anterior subluxation of C6 on C7 with anterior disc space narrowing.  The left facet joint appears mildly widened but intact.  No other fractures are identified.  There is reversal of the usual cervical lordosis with mild multilevel spondylosis.  IMPRESSION:  1.  Intra-articular mildly displaced fracture of the right C7 articulating facet is associated with mild anterolisthesis at C6-C7 and mild widening of the left facet joint.  This may represent an unstable injury. 2.  No other acute osseous findings. 3.  Mild multilevel spondylosis.  Critical Value/emergent results were called by telephone at the time of interpretation on 05/31/2011  at 1315 hours  to  Thomasene Lot, who verbally acknowledged these results.  Original Report Authenticated By: Gerrianne Scale, M.D.   Dg Hand Complete Left  05/31/2011  *RADIOLOGY REPORT*  Clinical Data: Fall, hand pain  LEFT HAND - COMPLETE 3+ VIEW  Comparison: None.  Findings: No carpal fracture. No metacarpal or phalangeal fracture. No dislocation  IMPRESSION:  No fracture or dislocation.  Original Report Authenticated By: Genevive Bi, M.D.   Dg Hand Complete Right  05/31/2011  *RADIOLOGY REPORT*  Clinical Data: Hand pain, fall  RIGHT HAND - COMPLETE 3+ VIEW  Comparison: None  Findings: No fracture of the carpals, metacarpals, or phalanges. No dislocation.  Radiocarpal joint intact.  IMPRESSION: No fracture.   Original Report Authenticated By: Genevive Bi, M.D.   Dg C-arm Gt 120 Min  06/01/2011  CLINICAL DATA: posterior cervical fusion   C-ARM GT 120 MIN  Fluoroscopy was utilized by the requesting physician.  No radiographic  interpretation.      Assessment/Plan: Transfer to floor.  Ct scan to assess alignment  LOS: 2 days  continue Pt/ot   Linkyn Gobin L 06/02/2011, 10:29 AM

## 2011-06-02 NOTE — Evaluation (Signed)
Occupational Therapy Evaluation Patient Details Name: Olivia West MRN: [id number] DOB: April 16, 1965 Today's Date: 06/02/2011  Problem List:  Patient Active Problem List  Diagnoses  . Fracture of spine, cervical, without spinal cord injury, closed    Past Medical History: History reviewed. No pertinent past medical history. Past Surgical History: History reviewed. No pertinent past surgical history.  OT Assessment/Plan/Recommendation OT Assessment Clinical Impression Statement: 46 yo female s/p fall with cervical fusion that could benefit from skilled OT. Pt currently with Rt UE deficits noted.  OT Recommendation/Assessment: Patient will need skilled OT in the acute care venue OT Problem List: Decreased strength;Decreased activity tolerance;Impaired balance (sitting and/or standing);Decreased coordination;Decreased knowledge of precautions;Pain;Impaired sensation OT Therapy Diagnosis : Generalized weakness;Acute pain OT Plan OT Frequency: Min 3X/week OT Treatment/Interventions: Self-care/ADL training;Therapeutic exercise;Neuromuscular education;DME and/or AE instruction;Therapeutic activities;Patient/family education;Balance training OT Recommendation Follow Up Recommendations: Outpatient OT Equipment Recommended: Rolling walker with 5" wheels;3 in 1 bedside comode Individuals Consulted Consulted and Agree with Results and Recommendations: Patient OT Goals Acute Rehab OT Goals OT Goal Formulation: With patient Time For Goal Achievement: 7 days ADL Goals Pt Will Perform Grooming: with modified independence;Standing at sink;Unsupported;with adaptive equipment ADL Goal: Grooming - Progress: Goal set today Pt Will Perform Upper Body Bathing: with modified independence;Standing at sink ADL Goal: Upper Body Bathing - Progress: Goal set today Pt Will Perform Lower Body Bathing: with modified independence;Standing at sink ADL Goal: Lower Body Bathing - Progress: Goal set today Pt Will  Perform Lower Body Dressing: with modified independence;Sit to stand from chair;Sit to stand from bed ADL Goal: Lower Body Dressing - Progress: Goal set today Pt Will Transfer to Toilet: with modified independence;3-in-1 ADL Goal: Toilet Transfer - Progress: Goal set today Pt Will Perform Toileting - Clothing Manipulation: with modified independence;Sitting on 3-in-1 or toilet ADL Goal: Toileting - Clothing Manipulation - Progress: Goal set today Pt Will Perform Toileting - Hygiene: with modified independence;Standing at 3-in-1/toilet ADL Goal: Toileting - Hygiene - Progress: Goal set today Arm Goals Pt Will Complete Theraputty Exer: Right upper extremity;Min resistance putty;to increase strength Arm Goal: Theraputty Exercises - Progress: Goal set today Additional Arm Goal #1: PT will demonstrate 2 fine motor hand exercises from hand handout without cues Arm Goal: Additional Goal #1 - Progress: Goal set today  OT Evaluation Precautions/Restrictions  Precautions Precaution Comments: Supplied with handout of precautions and advice on posture/body mechanics Required Braces or Orthoses: Yes Cervical Brace: Hard collar Restrictions Other Position/Activity Restrictions: limited weight bearing through BIL UE due to cervical precautions Prior Functioning Home Living Lives With: Spouse Receives Help From:  (spouse is a Media planner) Type of Home: Apartment Home Layout: One level Home Access: Stairs to enter Entrance Stairs-Rails: Doctor, general practice of Steps: 15 Bathroom Shower/Tub: Electrical engineer Equipment: None Prior Function Level of Independence: Independent with basic ADLs;Independent with homemaking with ambulation Able to Take Stairs?: Reciprically Driving: Yes 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 Pt holding tooth brush in Rt hand and applying paste with Lt UE Where Assessed - Grooming: Sitting, chair Upper Body Dressing: Performed;Minimal assistance Where Assessed - Upper Body Dressing: Sitting, chair;Supported Lower Body Dressing: Simulated;Moderate assistance Where Assessed -  Lower Body Dressing: Sitting, chair;Supported Ambulation Related to ADLs: Pt ambulating Min (A) ~ 100 ft with fatigue and LOB once posteriorly ADL Comments: Pt with decrease grasp in Rt hand, against gravity decreased wrist flexion, digit extension. Pt could benefit from theraputty yellow and fine motor exercerises. Pt educated on pad to pad , 2 digit extension and bil hand task (such as oral care, washing hands) Vision/Perception  Vision - History Baseline Vision: Wears glasses for distance only Patient Visual Report: No change from baseline Cognition Cognition Arousal/Alertness: Awake/alert Overall Cognitive Status: Appears within functional limits for tasks assessed Orientation Level: Oriented X4 Sensation/Coordination Sensation Light Touch: Appears Intact Coordination Gross Motor Movements are Fluid and Coordinated: Yes Fine Motor Movements are Fluid and Coordinated: No Extremity Assessment RUE Assessment RUE Assessment: Within Functional Limits (grossly not fully assessed due to cervical precautions) LUE Assessment LUE Assessment: Within Functional Limits (grossly not fully assessed due to cervical precautions) Mobility  Bed Mobility Bed Mobility: Yes Rolling Right: 5: Supervision Rolling Right Details (indicate cue type and reason): Good initiation of trunk without pulling with upper extremities Right Sidelying to Sit: 4: Min assist;HOB elevated  (comment degrees) Right Sidelying to Sit Details (indicate cue type and reason): 30 degrees, assistance for initiation only then patient able to complete transition using trunk muscles Sitting - Scoot to Edge of Bed: 4: Min assist Sitting - Scoot to [place] of Bed Details (indicate cue type and reason): Patient using technique to reciprocally scoot hips forward Transfers Transfers: Yes Sit to Stand: 4: Min assist;From bed;From chair/3-in-1;Without upper extremity assist Sit to Stand Details (indicate cue type and reason): Patient required assistance to initiate and stabilize. Stand to Sit: 4: Min assist;With upper extremity assist;To chair/3-in-1 Stand to Sit Details: Assistance to control descent to limit upper extremity use. Exercises   End of Session OT - End of Session Equipment Utilized During Treatment: Gait belt;Cervical collar Activity Tolerance: Patient tolerated treatment well Patient left: in chair;with call bell in reach Nurse Communication: Mobility status for transfers;Mobility status for ambulation General Behavior During Session: [facility] for tasks performed Cognition: [facility] for tasks performed   Lucile Shutters 06/02/2011, 8:58 AM  Pager: [phone]

## 2011-06-03 NOTE — Progress Notes (Signed)
Occupational Therapy Treatment Patient Details Name: Olivia West MRN: [id number] DOB: [birth year] Today's Date: 06/03/2011  OT Assessment/Plan OT Assessment/Plan Comments on Treatment Session: Ot to address Sink level ADLS next session and follow up on hand exercises. OT Plan: Discharge plan remains appropriate OT Frequency: Min 3X/week Follow Up Recommendations: Outpatient OT Equipment Recommended: Rolling walker with 5" wheels;3 in 1 bedside comode OT Goals Acute Rehab OT Goals OT Goal Formulation: With patient Time For Goal Achievement: 7 days ADL Goals Pt Will Perform Grooming: with modified independence;Standing at sink;Unsupported;with adaptive equipment ADL Goal: Grooming - Progress: Not met Pt Will Perform Upper Body Bathing: with modified independence;Standing at sink ADL Goal: Upper Body Bathing - Progress: Not met Pt Will Perform Lower Body Bathing: with modified independence;Standing at sink ADL Goal: Lower Body Bathing - Progress: Not met Pt Will Perform Lower Body Dressing: with modified independence;Sit to stand from chair;Sit to stand from bed ADL Goal: Lower Body Dressing - Progress: Not met Pt Will Transfer to Toilet: with modified independence;3-in-1 ADL Goal: Toilet Transfer - Progress: Not met Pt Will Perform Toileting - Clothing Manipulation: with modified independence;Sitting on 3-in-1 or toilet ADL Goal: Toileting - Clothing Manipulation - Progress: Not met Pt Will Perform Toileting - Hygiene: with modified independence;Standing at 3-in-1/toilet ADL Goal: Toileting - Hygiene - Progress: Not met Arm Goals Pt Will Complete Theraputty Exer: Right upper extremity;Min resistance putty;to increase strength Arm Goal: Theraputty Exercises - Progress: Progressing toward goal Additional Arm Goal #1: PT will demonstrate 2 fine motor hand exercises from hand handout without cues Arm Goal: Additional Goal #1 - Progress: Progressing toward goals  OT  Treatment Precautions/Restrictions  Precautions Precautions: Fall Precaution Comments: Cervical - re-educated on limiting upper extremity use with transitional movements Cervical Brace: Hard collar Restrictions Other Position/Activity Restrictions: limited weight bearing through BIL UE due to cervical precautions   ADL ADL ADL Comments: pt provided handout for fine motor exercises with yellow theraputty. Pt provided exercises for digit extension, flexion,abduction, thumb adduction, abduction . Pt using handout to return demonstrate all excercises. Pt is to complete exercises 3 times per day and 10 minutes max at a time. Pt with excellent return demo. Mobility  Bed Mobility Bed Mobility: No Rolling Left: 5: Supervision Rolling Left Details (indicate cue type and reason): Verbal cues for log roll technique and to limit pull on rail Left Sidelying to Sit: 4: Min assist Left Sidelying to Sit Details (indicate cue type and reason): for trunk initiation to prevent excessive use of upper extremities. Sitting - Scoot to Edge of Bed: 5: Supervision Sitting - Scoot to [place] of Bed Details (indicate cue type and reason): Using reciprocal action Transfers Sit to Stand: 4: Min assist;With upper extremity assist;From bed Sit to Stand Details (indicate cue type and reason): Educated on correct hand placement to and from device. Patient with limited upper extremtiy use to stand Stand to Sit: 4: Min assist;With upper extremity assist;To chair/3-in-1 Stand to Sit Details: Min-guard assistance - Improved control of descent. Exercises General Exercises - Lower Extremity Ankle Circles/Pumps: AROM;5 reps;Both;Seated Quad Sets: AROM;Both;5 reps;Supine Gluteal Sets: AROM;Both;5 reps;Supine Long Arc Quad: AROM;Both;5 reps;Seated  End of Session OT - End of Session Equipment Utilized During Treatment:  (theraputty yellow) Activity Tolerance: Patient tolerated treatment well Patient left: in bed;with call  bell in reach General Behavior During Session: [facility] for tasks performed Cognition: [facility] for tasks performed  Lucile Shutters  06/03/2011, 11:41 AM Pager: [phone]

## 2011-06-03 NOTE — Progress Notes (Signed)
Clinical Social Worker received consult for SNF. CSW consulted with RNCM. Pt is discharging home with home health PT. No further discharge needs identified. CSW is signing off, please reconsult if a need arises prior to discharge.   Dede Query, MSW, Theresia Majors [phone]

## 2011-06-03 NOTE — Progress Notes (Signed)
   CARE MANAGEMENT NOTE 06/03/2011  Patient:  [facility]   Account Number:  [id number]  Date Initiated:  06/03/2011  Documentation initiated by:  [facility]  Subjective/Objective Assessment:   POSTERIOR CERVICAL FUSION/FORAMINOTOMY LEVEL C6/7     Action/Plan:   lives at home with husband   Anticipated DC Date:  06/04/2011   Anticipated DC Plan:        DC Planning Services  CM consult      [facility] Choice  HOME HEALTH   Choice offered to / List presented to:  C-1 Patient           [facility] agency  [facility]   Status of service:  In process, will continue to follow Medicare Important Message given?   (If response is "NO", the following Medicare IM given date fields will be blank) Date Medicare IM given:   Date Additional Medicare IM given:    Discharge Disposition:    Per UR Regulation:    Comments:  06/03/2011 1410 Spoke to pt and provided her with [facility] agency list. Wynonia Lawman for [facility]. Alecia Lemming with referral and awaiting MD orders for [facility]. Pt is requesting RW and 3n1 for home. Isidoro Donning RN CCM Case Mgmt phone [phone]

## 2011-06-03 NOTE — Progress Notes (Signed)
Physical Therapy Treatment Patient Details Name: Olivia West MRN: [id number] DOB: Feb 08, 1966 Today's Date: 06/03/2011  PT Assessment/Plan  PT - Assessment/Plan Comments on Treatment Session: Patient with improved functional abilities. Will definitely require walker for safety and folllow up PT. PT Plan: Discharge plan remains appropriate;Frequency remains appropriate Follow Up Recommendations: Home health PT;Supervision for mobility/OOB Equipment Recommended: Rolling walker with 5" wheels;3 in 1 bedside comode PT Goals  Acute Rehab PT Goals PT Goal: Rolling Supine to Left Side - Progress: Progressing toward goal PT Goal: Supine/Side to Sit - Progress: Progressing toward goal PT Goal: Sit to Stand - Progress: Progressing toward goal PT Goal: Stand to Sit - Progress: Progressing toward goal PT Goal: Ambulate - Progress: Progressing toward goal  PT Treatment Precautions/Restrictions  Precautions Precaution Comments: Cervical - re-educated on limiting upper extremity use with transitional movements Required Braces or Orthoses: Yes Cervical Brace: Hard collar (at all times) Restrictions Other Position/Activity Restrictions: limited weight bearing through BIL UE due to cervical precautions Mobility (including Balance) Bed Mobility Rolling Left: 5: Supervision Rolling Left Details (indicate cue type and reason): Verbal cues for log roll technique and to limit pull on rail Left Sidelying to Sit: 4: Min assist Left Sidelying to Sit Details (indicate cue type and reason): for trunk initiation to prevent excessive use of upper extremities. Sitting - Scoot to Edge of Bed: 5: Supervision Sitting - Scoot to [place] of Bed Details (indicate cue type and reason): Using reciprocal action Transfers Sit to Stand: 4: Min assist;With upper extremity assist;From bed Sit to Stand Details (indicate cue type and reason): Educated on correct hand placement to and from device. Patient with limited upper  extremtiy use to stand Stand to Sit: 4: Min assist;With upper extremity assist;To chair/3-in-1 Stand to Sit Details: Min-guard assistance - Improved control of descent. Ambulation/Gait Ambulation/Gait Assistance: 4: Min assist Ambulation/Gait Assistance Details (indicate cue type and reason): Min-guard assistance - for safety. Patient with intermittent increased weight shift to right but can self correct due to walker for support. Verbal cues to relax shoulders as tendency to elevate and to look at path ahead and not the floor Ambulation Distance (Feet): 150 Feet Assistive device: Rolling walker Gait Pattern: Step-through pattern;Decreased stride length    Exercise  General Exercises - Lower Extremity Ankle Circles/Pumps: AROM;5 reps;Both;Seated Quad Sets: AROM;Both;5 reps;Supine Gluteal Sets: AROM;Both;5 reps;Supine Long Arc Quad: AROM;Both;5 reps;Seated End of Session PT - End of Session Equipment Utilized During Treatment: Gait belt;Cervical collar Activity Tolerance: Patient tolerated treatment well Patient left: in chair;with call bell in reach Nurse Communication: Mobility status for transfers;Mobility status for ambulation General Behavior During Session: [facility] for tasks performed Cognition: [facility] for tasks performed  Edwyna Perfect, PT  Pager [phone]  06/03/2011, 8:57 AM

## 2011-06-03 NOTE — Progress Notes (Signed)
Patient ID: Olivia West, female   DOB: 1966/03/19, 46 y.o.   MRN: [id number] BP 165/95  Pulse 72  Temp(Src) 100.7 F (38.2 C) (Oral)  Resp 18  Ht 5\' 10"  (1.778 m)  Wt 114.9 kg (253 lb 4.9 oz)  BMI 36.35 kg/m2  SpO2 95%  LMP 05/26/2011 Alert and oriented x 4. Speech clear and fluent. Weakness in right triceps , normal strength elsewhere Wound clean and dry no signs infection D/C tomorrow

## 2011-06-03 NOTE — Progress Notes (Signed)
Patient is currently awake and alert; has visitors at bedside at this time.  No patient needs verbalized at this time.  Will complete shift assessment when visitors have left for evening.  Patient encouraged to call with any needs prior to assessment.

## 2011-06-04 ENCOUNTER — Encounter (HOSPITAL_COMMUNITY): Payer: Self-pay | Admitting: Neurosurgery

## 2011-06-04 NOTE — Plan of Care (Signed)
Problem: Phase III Progression Outcomes Goal: Demonstrates donning/doffing brace Outcome: Not Applicable Date Met:  06/04/11 Patient has no brace.  Has aspen collar which she wears at all times.

## 2011-06-04 NOTE — Progress Notes (Signed)
Occupational Therapy Treatment Patient Details Name: Olivia West MRN: [id number] DOB: 1965-12-23 Today's Date: 06/04/2011  OT Assessment/Plan OT Assessment/Plan Comments on Treatment Session: Pt progressing well and is at adequate level to d/c home at this time. OT Plan: Discharge plan remains appropriate OT Frequency: Min 3X/week Follow Up Recommendations: Outpatient OT Equipment Recommended: Rolling walker with 5" wheels;3 in 1 bedside comode OT Goals Acute Rehab OT Goals OT Goal Formulation: With patient Time For Goal Achievement: 7 days ADL Goals Pt Will Perform Grooming: with modified independence;Standing at sink;Unsupported;with adaptive equipment ADL Goal: Grooming - Progress: Progressing toward goals Pt Will Perform Upper Body Bathing: with modified independence;Standing at sink ADL Goal: Upper Body Bathing - Progress: Progressing toward goals Pt Will Perform Lower Body Bathing: with modified independence;Standing at sink ADL Goal: Lower Body Bathing - Progress: Progressing toward goals Pt Will Perform Lower Body Dressing: with modified independence;Sit to stand from chair;Sit to stand from bed ADL Goal: Lower Body Dressing - Progress: Progressing toward goals Pt Will Transfer to Toilet: with modified independence;3-in-1 ADL Goal: Toilet Transfer - Progress: Progressing toward goals Pt Will Perform Toileting - Clothing Manipulation: with modified independence;Sitting on 3-in-1 or toilet ADL Goal: Toileting - Clothing Manipulation - Progress: Progressing toward goals Pt Will Perform Toileting - Hygiene: with modified independence;Standing at 3-in-1/toilet ADL Goal: Toileting - Hygiene - Progress: Progressing toward goals Arm Goals Pt Will Complete Theraputty Exer: Right upper extremity;Min resistance putty;to increase strength Arm Goal: Theraputty Exercises - Progress: Progressing toward goal Additional Arm Goal #1: PT will demonstrate 2 fine motor hand exercises from hand  handout without cues Arm Goal: Additional Goal #1 - Progress: Progressing toward goals  OT Treatment Precautions/Restrictions  Precautions Precautions: Fall Precaution Comments: Cervical - re-educated on limiting upper extremity use with transitional movements Required Braces or Orthoses: Yes Cervical Brace: Hard collar Restrictions Other Position/Activity Restrictions: limited weight bearing through BIL UE due to cervical precautions   ADL ADL Upper Body Dressing: Performed;Minimal assistance Upper Body Dressing Details (indicate cue type and reason): pt educated on ccollar pad change. pt positioned supine to doff front of brace. Pt able to verbalize steps back to therapist. Pt noted to have red bloody drainage from bottom of surgical site Where Assessed - Upper Body Dressing: Sitting, chair;Supported Where Assessed - Lower Body Dressing: Other (comment) (Pt unable to cross BIL le however husband can (A) with LB) Toilet Transfer: Supervision/safety;Simulated Statistician Details (indicate cue type and reason): pt using bil Ue on chair for transfer Toilet Transfer Method: Proofreader: Raised toilet seat with arms (or 3-in-1 over toilet) Equipment Used: Rolling walker Ambulation Related to ADLs: Pt ambulating ~150 ft Supervision level with decreased gait speed. Pt ambulating .88 gait velocity indicating fall risk. ADL Comments: Pt educated on aspen collar. RN Artist Pais) present in room for pad change Mobility  Bed Mobility Bed Mobility: Yes Rolling Left: 5: Supervision Left Sidelying to Sit: 5: Supervision Supine to Sit: 5: Supervision;HOB flat Transfers Transfers: Yes Sit to Stand: 5: Supervision;With upper extremity assist;With armrests;From chair/3-in-1 Stand to Sit: 5: Supervision;With upper extremity assist;To chair/3-in-1 Exercises    End of Session OT - End of Session Equipment Utilized During Treatment: Gait belt;Cervical collar Activity  Tolerance: Patient tolerated treatment well Patient left: in chair;with call bell in reach Nurse Communication: Mobility status for transfers;Mobility status for ambulation General Behavior During Session: [facility] for tasks performed Cognition: [facility] for tasks performed  Lucile Shutters  06/04/2011, 12:54 PM Pager: [phone]

## 2011-06-04 NOTE — Progress Notes (Signed)
Utilization review completed. Nicholis Stepanek, RN, BSN. 06/04/11  

## 2011-06-04 NOTE — Plan of Care (Signed)
Problem: Phase III Progression Outcomes Goal: Pain controlled on oral analgesia Outcome: Completed/Met Date Met:  06/04/11 Patient reporting adequate pain relief with combination of pain medications and muscle relaxants.

## 2011-06-04 NOTE — Plan of Care (Signed)
Problem: Phase I Progression Outcomes Goal: Initial discharge plan identified Outcome: Completed/Met Date Met:  06/04/11 Discussed potential discharge for Wednesday.  Going home with some assistance and equipment.

## 2011-06-04 NOTE — Progress Notes (Signed)
Physical Therapy Treatment Patient Details Name: Olivia West MRN: [id number] DOB: [birth year] Today's Date: 06/04/2011  PT Assessment/Plan  PT - Assessment/Plan Comments on Treatment Session: Pt ablet to complete stair negotiation with use of rail.  Pt will benefit from HHPT and will need RW at d/c.  Pt did c/o mild dizziness with ambulation but subsided with increase gait distance. PT Plan: Discharge plan remains appropriate;Frequency remains appropriate PT Frequency: Min 5X/week Follow Up Recommendations: Home health PT;Supervision for mobility/OOB Equipment Recommended: Rolling walker with 5" wheels;3 in 1 bedside comode PT Goals  Acute Rehab PT Goals PT Goal Formulation: With patient Time For Goal Achievement: 7 days Pt will Roll Supine to Right Side: with modified independence PT Goal: Rolling Supine to Right Side - Progress: Progressing toward goal Pt will Roll Supine to Left Side: with modified independence PT Goal: Rolling Supine to Left Side - Progress: Progressing toward goal Pt will go Supine/Side to Sit: with modified independence PT Goal: Supine/Side to Sit - Progress: Progressing toward goal Pt will go Sit to Supine/Side: with modified independence PT Goal: Sit to Supine/Side - Progress: Progressing toward goal Pt will go Sit to Stand: with modified independence;without upper extremity assist PT Goal: Sit to Stand - Progress: Progressing toward goal Pt will go Stand to Sit: with modified independence;with upper extremity assist PT Goal: Stand to Sit - Progress: Progressing toward goal Pt will Ambulate: >150 feet;with modified independence;with least restrictive assistive device PT Goal: Ambulate - Progress: Progressing toward goal Pt will Go Up / Down Stairs: Flight;with min assist;with rail(s) PT Goal: Up/Down Stairs - Progress: Met  PT Treatment Precautions/Restrictions  Precautions Precautions: Fall Precaution Comments: Cervical - re-educated on limiting upper  extremity use with transitional movements Required Braces or Orthoses: Yes Cervical Brace: Hard collar Restrictions Other Position/Activity Restrictions: limited weight bearing through BIL UE due to cervical precautions Mobility (including Balance) Bed Mobility Bed Mobility: No Rolling Left: 5: Supervision Left Sidelying to Sit: 5: Supervision Supine to Sit: 5: Supervision;HOB flat Transfers Transfers: Yes Sit to Stand: 5: Supervision;With upper extremity assist;With armrests;From chair/3-in-1 Sit to Stand Details (indicate cue type and reason): Supervision for safety with cues for hand placement Stand to Sit: 5: Supervision;With upper extremity assist;To chair/3-in-1 Stand to Sit Details: Supervision for safety with cues for hand placement.  Pt tends to keep hands on RW during transfer Ambulation/Gait Ambulation/Gait: Yes Ambulation/Gait Assistance: Other (comment) (minguard) Ambulation/Gait Assistance Details (indicate cue type and reason): Minguard for safety with cues for upright posture and RW placement Ambulation Distance (Feet): 150 Feet Assistive device: Rolling walker Gait Pattern: Step-through pattern;Decreased stride length Gait velocity: decreased gait speed with guarded gait Stairs: Yes Stairs Assistance: Other (comment) (minguard (A)) Stair Management Technique: One rail Right;Sideways Number of Stairs: 8  Wheelchair Mobility Wheelchair Mobility: No  Posture/Postural Control Posture/Postural Control: No significant limitations Exercise  General Exercises - Lower Extremity Ankle Circles/Pumps: AROM;5 reps;Both;Seated Gluteal Sets: AROM;Both;5 reps;Supine Long Arc Quad: AROM;Both;5 reps;Seated End of Session PT - End of Session Equipment Utilized During Treatment: Gait belt;Cervical collar Activity Tolerance: Patient tolerated treatment well Patient left: in chair;with call bell in reach Nurse Communication: Mobility status for transfers;Mobility status for  ambulation General Behavior During Session: [name] for tasks performed Cognition: [facility] for tasks performed  Olivia West 06/04/2011, 1:26 PM Pager:  [phone]

## 2011-06-04 NOTE — Progress Notes (Signed)
Subjective: Patient reports feeling good. Maybe a little bit  stronger  Objective: Vital signs in last 24 hours: Temp:  [97.2 F (36.2 C)-99.1 F (37.3 C)] 98.2 F (36.8 C) (03/06 1724) Pulse Rate:  [75-95] 81  (03/06 1724) Resp:  [18-20] 20  (03/06 1724) BP: (139-155)/(89-108) 147/96 mmHg (03/06 1724) SpO2:  [93 %-99 %] 94 % (03/06 1724)  Intake/Output from previous day:   Intake/Output this shift:    weakness in right triceps, wrist extensors. Wound with a small amount of drainage. Clean.  Lab Results: No results found for this basename: WBC:2,HGB:2,HCT:2,PLT:2 in the last 72 hours BMET No results found for this basename: NA:2,K:2,CL:2,CO2:2,GLUCOSE:2,BUN:2,CREATININE:2,CALCIUM:2 in the last 72 hours  Studies/Results: No results found.  Assessment/Plan: DC tomorrow. Arrange followup with primary care Dr. Shannan Harper  LOS: 4 days     Lonie Rummell L 06/04/2011, 7:02 PM

## 2011-06-04 NOTE — Transfer of Care (Signed)
Transfer of care completed on paper.

## 2011-06-04 NOTE — Progress Notes (Signed)
   CARE MANAGEMENT NOTE 06/04/2011  Patient:  [facility]   Account Number:  [id number]  Date Initiated:  06/03/2011  Documentation initiated by:  [facility]  Subjective/Objective Assessment:   POSTERIOR CERVICAL FUSION/FORAMINOTOMY LEVEL C6/7     Action/Plan:   lives at home with husband   Anticipated DC Date:  06/04/2011   Anticipated DC Plan:  HOME W HOME HEALTH SERVICES      DC Planning Services  CM consult      [facility] Choice  HOME HEALTH   Choice offered to / List presented to:  C-1 Patient   DME arranged  3-N-1  Levan Hurst      DME agency  [facility]     HH arranged  HH-2 PT      [facility] agency  [facility]   Status of service:  Completed, signed off Medicare Important Message given?   (If response is "NO", the following Medicare IM given date fields will be blank) Date Medicare IM given:   Date Additional Medicare IM given:    Discharge Disposition:  HOME W HOME HEALTH SERVICES  Per UR Regulation:    Comments:  06/04/2011 1400 Contacted [facility] and they will follow pt for [facility]. Contacted [facility] for RW and 3n1 for home. Isidoro Donning RN CCM Case Mgmt phone [phone]  06/03/2011 1410 Spoke to pt and provided her with [facility] agency list. Wynonia Lawman for [facility]. Alecia Lemming with referral and awaiting MD orders for [facility]. Pt is requesting RW and 3n1 for home. Isidoro Donning RN CCM Case Mgmt phone [phone]

## 2011-06-04 NOTE — Progress Notes (Signed)
OT NOTE  Pt educated on aspen c-collar, how to change pads and positioning. Pt noted to have bright red drainage at the bottom of incision. RN Artist Pais) shown active drainage.    Olivia West   OTR/L Pager: [phone] Office: [phone] .

## 2011-06-05 MED ORDER — ATENOLOL 25 MG PO TABS
25.0000 mg | ORAL_TABLET | Freq: Every day | ORAL | Status: DC
  Administered 2011-06-05: 25 mg via ORAL
  Filled 2011-06-05: qty 1

## 2011-06-05 MED ORDER — HYDROCODONE-ACETAMINOPHEN 5-325 MG PO TABS: 1.0000 | ORAL_TABLET | Freq: Four times a day (QID) | ORAL | Status: AC | PRN

## 2011-06-05 MED ORDER — CYCLOBENZAPRINE HCL 10 MG PO TABS: 10.0000 mg | ORAL_TABLET | Freq: Three times a day (TID) | ORAL | Status: AC | PRN

## 2011-06-05 MED ORDER — ATENOLOL 25 MG PO TABS: 25.0000 mg | ORAL_TABLET | Freq: Every day | ORAL | Status: AC

## 2011-06-05 MED FILL — Phenylephrine-NaCl Pref Syr 0.4 MG/10ML-0.9% (40 MCG/ML): INTRAVENOUS | Qty: 5 | Status: AC

## 2011-06-05 MED FILL — Propofol IV Emul 10 MG/ML: INTRAVENOUS | Qty: 20 | Status: AC

## 2011-06-05 MED FILL — Ondansetron HCl Inj 4 MG/2ML (2 MG/ML): INTRAMUSCULAR | Qty: 4 | Status: AC

## 2011-06-05 MED FILL — Midazolam HCl Inj 2 MG/2ML (Base Equivalent): INTRAMUSCULAR | Qty: 2 | Status: AC

## 2011-06-05 MED FILL — Fentanyl Citrate Inj 0.05 MG/ML: INTRAMUSCULAR | Qty: 8 | Status: AC

## 2011-06-05 MED FILL — Glycopyrrolate Inj 0.2 MG/ML: INTRAMUSCULAR | Qty: 3 | Status: AC

## 2011-06-05 MED FILL — Dexamethasone Sodium Phosphate Inj 10 MG/ML: INTRAMUSCULAR | Qty: 1 | Status: AC

## 2011-06-05 MED FILL — Neostigmine Methylsulfate Inj 1 MG/ML: INTRAMUSCULAR | Qty: 5 | Status: AC

## 2011-06-05 MED FILL — Rocuronium Bromide IV Soln 50 MG/5ML (10 MG/ML): INTRAVENOUS | Qty: 10 | Status: AC

## 2011-06-05 NOTE — Discharge Summary (Signed)
Physician Discharge Summary  Patient ID: Olivia West MRN: [id number] DOB/AGE: [birth year] 46 y.o.  Admit date: 05/31/2011 Discharge date: 06/05/2011  Admission Diagnoses:C6/7 Right facet fracture  Discharge Diagnoses: C6/7 Right facet fracture Principal Problem:  *Fracture of spine, cervical, without spinal cord injury, closed   Discharged Condition: good  Hospital Course: Admitted to hospital after sustaining a fall and having a unilateral jumped facet leading to a fracture at C6/7 on the right. She underwent a posterior cervical fusion and decompression due to the instability and weakness in the Right triceps. Postoperatively she has had persistent hypertension. She has been started on tenormin 25mg  per day. Wound is clean and dry at discharge. She still has weakness in the right triceps at discharge. Post CT shows all hardware in good position. I used medtronic instrumentation,and a songar cable.  Consults: Internal medicine  Significant Diagnostic Studies: CT spine  Treatments: surgery: Posterior  C6/7 cervical fusion, lateral mass screws, Songar Cable.   Discharge Exam: Blood pressure 146/97, pulse 93, temperature 101.2 F (38.4 C), temperature source Oral, resp. rate 18, height 5\' 10"  (1.778 m), weight 114.9 kg (253 lb 4.9 oz), last menstrual period 05/26/2011, SpO2 96.00%. as stated.  Disposition: Final discharge disposition not confirmed   Medication List  As of 06/05/2011  6:53 PM   TAKE these medications         atenolol 25 MG tablet   Commonly known as: TENORMIN   Take 1 tablet (25 mg total) by mouth daily.      cyclobenzaprine 10 MG tablet   Commonly known as: FLEXERIL   Take 1 tablet (10 mg total) by mouth 3 (three) times daily as needed for muscle spasms.      HYDROcodone-acetaminophen 5-325 MG per tablet   Commonly known as: NORCO   Take 1 tablet by mouth every 6 (six) hours as needed for pain.           Follow-up Information    Follow up with [facility] . (Home Health Physical Therapy)    Contact information:   [phone]         Signed: Saqib Cazarez L 06/05/2011, 6:53 PM

## 2011-06-05 NOTE — Progress Notes (Signed)
Orthopedic Tech Progress Note Patient Details:  Olivia West Oct 29, 1965 [id number]  Other Ortho Devices Type of Ortho Device: [place] cervical collar Ortho Device Location: neck Ortho Device Interventions: Freeman Caldron, Jaman Aro 06/05/2011, 7:51 PM

## 2011-06-05 NOTE — Progress Notes (Signed)
Agree with SPT treatment note.  [place], [place] DPT [phone]

## 2011-06-05 NOTE — Progress Notes (Signed)
Physical Therapy Treatment Patient Details Name: Olivia West MRN: [id number] DOB: March 17, 1966 Today's Date: 06/05/2011  PT Assessment/Plan  PT - Assessment/Plan Comments on Treatment Session: Patient able to complete stair negotionation sideways wih use of rail.  Patient did complain of dizziness and shortness of breath with ambulation.  Discussed with patient that if her dizziness continued to follow-up with her physician.  Patient will benefit from HHPT and will need a RW and 3-in-1 at d/c. PT Plan: Discharge plan remains appropriate PT Frequency: Min 5X/week Follow Up Recommendations: Home health PT;Supervision for mobility/OOB Equipment Recommended: Rolling walker with 5" wheels;3 in 1 bedside comode PT Goals  Acute Rehab PT Goals PT Goal Formulation: With patient Time For Goal Achievement: 7 days Pt will go Sit to Stand: with modified independence;without upper extremity assist PT Goal: Sit to Stand - Progress: Progressing toward goal Pt will go Stand to Sit: with modified independence;with upper extremity assist PT Goal: Stand to Sit - Progress: Progressing toward goal Pt will Ambulate: >150 feet;with modified independence;with least restrictive assistive device PT Goal: Ambulate - Progress: Progressing toward goal Pt will Go Up / Down Stairs: Flight;with min assist;with rail(s) PT Goal: Up/Down Stairs - Progress: Met  PT Treatment Precautions/Restrictions  Precautions Precautions: Fall Precaution Comments: Cervical - re-educated on limiting upper extremity use with transitional movements Required Braces or Orthoses: Yes Cervical Brace: Hard collar Restrictions Weight Bearing Restrictions: No Other Position/Activity Restrictions: limited weight bearing through BIL UE due to cervical precautions Mobility (including Balance) Bed Mobility Bed Mobility: No Sitting - Scoot to Edge of Bed: 7: Independent (In chair) Transfers Transfers: Yes Sit to Stand: 5: Supervision Sit to  Stand Details (indicate cue type and reason): Patient requires supervision and verbal cues for hand placement. Stand to Sit: 5: Supervision Stand to Sit Details: Patient requires supervision and verbal cues for hand placement.  Patient requires UE assist to control decent. Ambulation/Gait Ambulation/Gait: Yes Ambulation/Gait Assistance: Other (comment) (Min guard) Ambulation/Gait Assistance Details (indicate cue type and reason): Min guard for safety. Ambulation Distance (Feet): 150 Feet Assistive device: Rolling walker Gait Pattern: Step-through pattern;Decreased stride length (Wide base of support) Gait velocity: decreased gait speed with guarded gait Stairs: Yes Stairs Assistance: Other (comment) (Min guard for safety with VC for foot placement with descent) Stair Management Technique: One rail Right;Sideways Number of Stairs: 16  Wheelchair Mobility Wheelchair Mobility: No  Posture/Postural Control Posture/Postural Control: No significant limitations Balance Balance Assessed: Yes Static Standing Balance Static Standing - Balance Support: Bilateral upper extremity supported Static Standing - Level of Assistance: Other (comment) (Min guard for safety) Static Standing - Comment/# of Minutes: while performing exercises at counter Exercise  General Exercises - Lower Extremity Ankle Circles/Pumps: AROM;10 reps;Both;Seated Gluteal Sets: AROM;10 reps;Seated (With 2-3 second hold) Long Arc Quad: AROM;10 reps;Both;Seated (With 2-3 second hold) Hip ABduction/ADduction: AROM;Both;5 reps;Standing (With min guard and UE support for safety) Hip Flexion/Marching: AROM;Both;10 reps;Seated End of Session PT - End of Session Equipment Utilized During Treatment: Gait belt;Cervical collar Activity Tolerance: Patient tolerated treatment well;Patient limited by fatigue (Pt had shortness of breath with stairs and post treatment) Patient left: in chair;with call bell in reach General Behavior During  Session: [facility] for tasks performed Cognition: [facility] for tasks performed  Ezzard Standing SPT 06/05/2011, 9:06 AM

## 2011-06-05 NOTE — Progress Notes (Signed)
Pt ordered to be discharged at 1900,instructions printed,explained and given to pt, [place] neck collar supplied by ortho tech and was given to patient. Patient taken down by wheelchair at 2055 accompanied by husband, Obasogie-Asidi, Dariela Stoker Efe

## 2011-06-05 NOTE — Consult Note (Signed)
Reason for Consult: Blood pressure management Referring Physician: Dr. Alan Ripper Ehrich is an 46 y.o. female.  HPI:  The patient is a pleasant 46 year old black female admitted to the neurosurgery service with intra-articular mildly displaced fracture of the right C7 articulating facet associated with mild anterior listhesis at C6-7 with mild widening of the left facet joint and had surgery/ ORIF on 3/3 per Dr. Mikal Plane was been noted to have persistently elevated  blood pressures in the up to the 170s earlier on this hospital stay and in the past 24-48 hours mostly in the upper 140s to 150s, and him and her medicine/hospitalist consulted for management of hypertension. She states that her blood pressures at her PCPs office in the past on the noted to be a little on the high side sometimes(last saw her PCP in 2011),ateno and that her family history is positive for hypertension. Denies chest pain, headaches, shortness of breath and no dizziness.   History reviewed. No pertinent past medical history.   family history: Mother, sisters and aunts have hypertension.  Social History:  reports that she has never smoked. She does not have any smokeless tobacco history on file. She reports that she does not drink alcohol. Her drug history not on file.  Allergies: No Known Allergies  Medications: Epic medication  list reviewed.  Review of systems: As per history of present illness, other review of systems negative.    Blood pressure 149/92, pulse 99, temperature 98.4 F (36.9 C), temperature source Oral, resp. rate 16, height 5\' 10"  (1.778 m), weight 114.9 kg (253 lb 4.9 oz), last menstrual period 05/26/2011, SpO2 98.00%.  General Appearance:    Alert, cooperative, no distress, appears stated age.she has a cervical collar on.  Lungs:     Clear to auscultation bilaterally, respirations unlabored   Heart:    Regular rate and rhythm, S1 and S2 normal, no murmur, rub   or gallop  Abdomen:     Soft,  non-tender, bowel sounds active all four quadrants,    no masses, no organomegaly  Extremities:   Extremities normal, atraumatic, no cyanosis or edema  Neurologic:   CNII-XII intact, normal strength, sensation and reflexes    throughout   Laboratory data Urinalysis on 3/3-negative for protein. Hemoglobin 14.0 hematocrit 41-on 3/2, BUN 9 creatinine 0.8 potassium 4.1 sodium 139 -on 3/2  Impressions/recommendations: Active Problems: 1.Hypertension -As discussed above Given her persistently elevated blood pressures(with patient not reporting any uncontrolled pain) and positive family history,will recommend starting patient on antihypertensive -atenolol 25 mg daily. Also recommend outpatient followup with her PCP for further monitoring and adjustment of medication dose as appropriate for optimal blood pressure control.  2.*Fracture of spine, cervical, without spinal cord injury, closed -Per neurosurgery.  Thanks  for allowing [place] to participate in the care of this patient Kela Millin 06/05/2011, 4:57 PM   [phone]

## 2011-06-05 NOTE — Progress Notes (Signed)
Orthopedic Tech Progress Note Patient Details:  Olivia West May 23, 1965 [id number]  Other Ortho Devices Type of Ortho Device: [place] cervical collar Ortho Device Location: neck Ortho Device Interventions: Ordered Viewed order from rn order list  Nikki Dom 06/05/2011, 7:52 PM

## 2011-06-05 NOTE — Discharge Instructions (Signed)
Cervical Spine Fracture, Stable The muscles and ligaments in your neck have been stretched and a bone in your neck appears to be broken. This is known as a cervical spine fracture. This fracture appears to be stable. This means that the chances of it causing you problems while it is healing are very small. Your caregiver feels that no harm will come to you if you are followed up at home. DIAGNOSIS  The diagnosis of your injury had been made with X-rays of your neck. Often CT scans and or MRI is used to confirm the diagnosis and clarify how your injury should be treated. Generally it is the examination of your neck, arms and legs and the history of your injury which prompts the caregiver to order these tests. HOME CARE INSTRUCTIONS  Ice packs to the neck or areas of pain approximately 3 to 4 times a day for 15 to 20 minutes. Do this for 2 days.   If given a cervical collar, wear as instructed. Do not remove any collar unless instructed by a caregiver.   Only take over-the-counter or prescription medicines for pain, discomfort, or fever as directed by your caregiver.   If your caregiver has given you a follow up appointment if is very important to keep that appointment. Not keeping the appointment could result in a chronic or permanent injury, pain, and disability. If there is any problem keeping the appointment you must call back to this facility for assistance.  Recheck with the hospital or clinic after a radiologist has read your X-rays if this was not done when you were initially evaluated. This will determine if further studies are necessary. It is your responsibility to obtain your X-ray results. Ask your caregiver how you are to be informed about your X-ray results. X-rays may be repeated in one week to three weeks. This is to:  Make sure that hairline fractures not detected on the first X-rays are not overlooked.   Find ligaments that are completely disrupted which have left your neck  unstable.  SEEK IMMEDIATE MEDICAL CARE IF:   You have increasing pain in your neck.   You develop difficulties swallowing or breathing.   You have numbness, weakness, burning pain or movement problems in the arms or legs.   You have difficulty walking.   You develop bowel or bladder retention or incontinence.   You have problems with coordination.  MAKE SURE YOU:   Understand these instructions.   Will watch your condition.   Will get help right away if you are not doing well or get worse.  Document Released: 02/02/2004 Document Revised: 03/06/2011 Document Reviewed: 10/29/2007 [employer] Patient Information 2012 [employer]. 

## 2011-07-17 ENCOUNTER — Emergency Department (HOSPITAL_COMMUNITY)
Admission: EM | Admit: 2011-07-17 | Discharge: 2011-07-17 | Disposition: A | Discharge status: Home / Self Care | Payer: BC Managed Care – PPO | Attending: Emergency Medicine | Admitting: Emergency Medicine

## 2011-07-17 ENCOUNTER — Encounter (HOSPITAL_COMMUNITY): Payer: Self-pay | Admitting: Emergency Medicine

## 2011-07-17 DIAGNOSIS — J029 Acute pharyngitis, unspecified: Secondary | ICD-10-CM | POA: Insufficient documentation

## 2011-07-17 DIAGNOSIS — R509 Fever, unspecified: Secondary | ICD-10-CM | POA: Insufficient documentation

## 2011-07-17 MED ORDER — AZITHROMYCIN 250 MG PO TABS: 250.0000 mg | ORAL_TABLET | Freq: Every day | ORAL | Status: AC

## 2011-07-17 MED ORDER — NAPROXEN 500 MG PO TABS: 500.0000 mg | ORAL_TABLET | Freq: Two times a day (BID) | ORAL | Status: AC

## 2011-07-17 NOTE — Discharge Instructions (Signed)
Sore Throat Sore throats may be caused by bacteria and viruses. They may also be caused by:  Smoking.   Pollution.   Allergies.  If a sore throat is due to strep infection (a bacterial infection), you may need:  A throat swab.   A culture test to verify the strep infection.  You will need one of these:  An antibiotic shot.   Oral medicine for a full 10 days.  Strep infection is very contagious. A doctor should check any close contacts who have a sore throat or fever. A sore throat caused by a virus infection will usually last only 3-4 days. Antibiotics will not treat a viral sore throat.  Infectious mononucleosis (a viral disease), however, can cause a sore throat that lasts for up to 3 weeks. Mononucleosis can be diagnosed with blood tests. You must have been sick for at least 1 week in order for the test to give accurate results. HOME CARE INSTRUCTIONS   To treat a sore throat, take mild pain medicine.   Increase your fluids.   Eat a soft diet.   Do not smoke.   Gargling with warm water or salt water (1 tsp. salt in 8 oz. water) can be helpful.   Try throat sprays or lozenges or sucking on hard candy to ease the symptoms.  Call your doctor if your sore throat lasts longer than 1 week.  SEEK IMMEDIATE MEDICAL CARE IF:  You have difficulty breathing.   You have increased swelling in the throat.   You have pain so severe that you are unable to swallow fluids or your saliva.   You have a severe headache, a high fever, vomiting, or a red rash.  Document Released: 04/24/2004 Document Revised: 03/06/2011 Document Reviewed: 03/04/2007 [facility] Patient Information 2012 [place], [place].   Take antibiotic as tracked it encases to strep throat. If you're improved in 2 days and it was strep throat if not most likely a viral sore throat. Followup with your primary care doctor equal in the next few days if not better return here if worse.

## 2011-07-17 NOTE — ED Notes (Signed)
C/o sore throat and fever since last night, afebrile on arrival, denies CP/SOB or other complaints, states recovery from recent neck surgery is progressing well - has follow up apt next week, NAD

## 2011-07-17 NOTE — ED Provider Notes (Signed)
History     CSN: [id number]  Arrival date & time 07/17/11  1041   First MD Initiated Contact with Patient 07/17/11 1110      Chief Complaint  Patient presents with  . Sore Throat    (Consider location/radiation/quality/duration/timing/severity/associated sxs/prior treatment) Patient is a 46 y.o. female presenting with pharyngitis. The history is provided by the patient.  Sore Throat This is a new problem. The current episode started yesterday. The problem occurs constantly. The problem has not changed since onset.Pertinent negatives include no chest pain, no abdominal pain, no headaches and no shortness of breath.   Patient status post posterior neck surgery beginning of March for cervical fractures with fusion and improvement of the foramen. Surgery was done by Dr. Mikal Plane. Staples were removed several weeks ago. Patient has been doing fine. No associated upper respiratory symptoms other than the sore throat no congestion cough. Is associated with subjective fever but not documented here. No headache no difficulty swallowing no bodyaches no rash.  History reviewed. No pertinent past medical history.  Past Surgical History  Procedure Date  . Posterior cervical fusion/foraminotomy 06/01/2011    Procedure: POSTERIOR CERVICAL FUSION/FORAMINOTOMY LEVEL 1;  Surgeon: Carmela Hurt, MD;  Location: [facility];  Service: Neurosurgery;  Laterality: N/A;  Posterior Cervical Fusion Cervical Six-Seven    No family history on file.  History  Substance Use Topics  . Smoking status: Never Smoker   . Smokeless tobacco: Not on file  . Alcohol Use: No    OB History    Grav Para Term Preterm Abortions TAB SAB Ect Mult Living                  Review of Systems  Constitutional: Positive for fever and diaphoresis. Negative for chills.  HENT: Positive for sore throat. Negative for congestion, trouble swallowing, neck pain, neck stiffness and voice change.   Eyes: Negative for visual  disturbance.  Respiratory: Negative for shortness of breath.   Cardiovascular: Negative for chest pain.  Gastrointestinal: Negative for nausea, vomiting, abdominal pain and diarrhea.  Genitourinary: Negative for dysuria and flank pain.  Musculoskeletal: Negative for back pain.  Skin: Negative for rash.  Neurological: Negative for headaches.  Hematological: Negative for adenopathy.    Allergies  Review of patient's allergies indicates no known allergies.  Home Medications   Current Outpatient Rx  Name Route Sig Dispense Refill  . ATENOLOL 25 MG PO TABS Oral Take 1 tablet (25 mg total) by mouth daily. 30 tablet 2  . CYCLOBENZAPRINE HCL 10 MG PO TABS Oral Take 10 mg by mouth 3 (three) times daily as needed. As needed for muscle spasms.    Marland Kitchen HYDROCODONE-ACETAMINOPHEN 5-325 MG PO TABS Oral Take 1 tablet by mouth every 6 (six) hours as needed. As needed for neck pain.    . IBUPROFEN 200 MG PO TABS Oral Take 200 mg by mouth every 6 (six) hours as needed. As needed for pain.    Marland Kitchen AZITHROMYCIN 250 MG PO TABS Oral Take 1 tablet (250 mg total) by mouth daily. Take first 2 tablets together, then 1 every day until finished. 6 tablet 0  . NAPROXEN 500 MG PO TABS Oral Take 1 tablet (500 mg total) by mouth 2 (two) times daily. 14 tablet 0    BP 169/107  Pulse 90  Temp(Src) 98.6 F (37 C) (Oral)  Resp 16  SpO2 98%  Physical Exam  Nursing note and vitals reviewed. Constitutional: She is oriented to person, place,  and time. She appears well-developed and well-nourished. No distress.  HENT:  Head: Normocephalic and atraumatic.  Mouth/Throat: Oropharyngeal exudate present.       Pharynx with erythema some left and right tonsillar exudate, slight. No uvula deviation no soft palate swelling.  Eyes: Conjunctivae and EOM are normal. Pupils are equal, round, and reactive to light.  Neck: Normal range of motion. Neck supple.  Cardiovascular: Normal rate, regular rhythm and normal heart sounds.   No  murmur heard. Pulmonary/Chest: Effort normal and breath sounds normal. No respiratory distress.  Abdominal: Soft. Bowel sounds are normal. There is no tenderness.  Musculoskeletal: Normal range of motion.  Lymphadenopathy:    She has no cervical adenopathy.  Neurological: She is alert and oriented to person, place, and time. No cranial nerve deficit. She exhibits normal muscle tone. Coordination normal.  Skin: No rash noted.    ED Course  Procedures (including critical care time)  Labs Reviewed - No data to display No results found.   1. Sore throat       MDM    Patient status post posterior neck surgery back in early March staples already removed for cervical fracture. Onset of sore throat and fever subjective yesterday and continued today. Patient's pharynx does show some evidence of erythema with enlargement of the tonsils and some exudate. No evidence of peritonsillar abscess. Patient is nontoxic no acute distress afebrile here has been taking Motrin for the fevers. No significant cervical adenopathy patient able to swallow fine. Suspect onset of viral or strep pharyngitis patient's never had strep before. Patient prefers just be treated with antibiotics as opposed to having rapid strep done. Patient will require close followup if not better to make sure that there is no connection with the recent surgery. Currently not concerned about significant pharyngeal abscess or infectious complication related to the recent surgery.      Shelda Jakes, MD 07/17/11 [phone]

## 2011-07-25 ENCOUNTER — Encounter (HOSPITAL_COMMUNITY): Payer: Self-pay | Admitting: Emergency Medicine

## 2011-07-31 ENCOUNTER — Other Ambulatory Visit: Payer: Self-pay | Admitting: Neurosurgery

## 2011-07-31 DIAGNOSIS — M542 Cervicalgia: Secondary | ICD-10-CM

## 2011-08-05 ENCOUNTER — Other Ambulatory Visit: Payer: Self-pay | Admitting: Neurosurgery

## 2011-08-05 ENCOUNTER — Ambulatory Visit
Admission: RE | Admit: 2011-08-05 | Discharge: 2011-08-05 | Disposition: A | Discharge status: Home / Self Care | Payer: BC Managed Care – PPO | Source: Ambulatory Visit | Attending: Neurosurgery | Admitting: Neurosurgery

## 2011-08-05 DIAGNOSIS — M542 Cervicalgia: Secondary | ICD-10-CM

## 2011-08-05 MED ORDER — IOHEXOL 300 MG/ML  SOLN
75.0000 mL | Freq: Once | INTRAMUSCULAR | Status: AC | PRN
  Administered 2011-08-05: 75 mL via INTRAVENOUS

## 2013-04-01 ENCOUNTER — Encounter (HOSPITAL_COMMUNITY): Payer: Self-pay | Admitting: Emergency Medicine

## 2013-04-01 ENCOUNTER — Emergency Department (HOSPITAL_COMMUNITY): Payer: BC Managed Care – PPO

## 2013-04-01 DIAGNOSIS — H9209 Otalgia, unspecified ear: Secondary | ICD-10-CM | POA: Insufficient documentation

## 2013-04-01 DIAGNOSIS — J3489 Other specified disorders of nose and nasal sinuses: Secondary | ICD-10-CM | POA: Insufficient documentation

## 2013-04-01 DIAGNOSIS — Z8611 Personal history of tuberculosis: Secondary | ICD-10-CM | POA: Insufficient documentation

## 2013-04-01 DIAGNOSIS — R911 Solitary pulmonary nodule: Secondary | ICD-10-CM | POA: Insufficient documentation

## 2013-04-01 DIAGNOSIS — B9789 Other viral agents as the cause of diseases classified elsewhere: Secondary | ICD-10-CM | POA: Insufficient documentation

## 2013-04-01 NOTE — ED Notes (Signed)
The pt has been ill since dec 25th with cough cold head congestion rt facial pain.  She has a productive cough brown sputum.  Also both her ears  Are hurting.  lmp 2 weeks ago

## 2013-04-02 ENCOUNTER — Emergency Department (HOSPITAL_COMMUNITY)
Admission: EM | Admit: 2013-04-02 | Discharge: 2013-04-02 | Disposition: A | Discharge status: Home / Self Care | Payer: BC Managed Care – PPO | Attending: Emergency Medicine | Admitting: Emergency Medicine

## 2013-04-02 DIAGNOSIS — B349 Viral infection, unspecified: Secondary | ICD-10-CM

## 2013-04-02 DIAGNOSIS — R911 Solitary pulmonary nodule: Secondary | ICD-10-CM

## 2013-04-02 MED ORDER — FLUTICASONE PROPIONATE 50 MCG/ACT NA SUSP: 2.0000 | Freq: Every day | NASAL | Status: DC

## 2013-04-02 NOTE — ED Provider Notes (Signed)
CSN: [id number]     Arrival date & time 04/01/13  2050 History   First MD Initiated Contact with Patient 04/02/13 0022     Chief Complaint  Patient presents with  . upper respiratory symptoms    (Consider location/radiation/quality/duration/timing/severity/associated sxs/prior Treatment) The history is provided by the patient.  Olivia West is a 48 y.o. female hx of TB in the past (treated), here with cough and congestion. Intermittent cough for mainly sinus congestion for the last week. Denies any runny nose but does have ear pain bilaterally. She does have some cough with brownish sputum. Denies any fevers. She had TB in the past but no recent exposure to TB.    History reviewed. No pertinent past medical history. Past Surgical History  Procedure Laterality Date  . Posterior cervical fusion/foraminotomy  06/01/2011    Procedure: POSTERIOR CERVICAL FUSION/FORAMINOTOMY LEVEL 1;  Surgeon: Winfield Cunas, MD;  Location: [facility] [place] [facility];  Service: Neurosurgery;  Laterality: N/A;  Posterior Cervical Fusion Cervical Six-Seven   No family history on file. History  Substance Use Topics  . Smoking status: Never Smoker   . Smokeless tobacco: Not on file  . Alcohol Use: No   OB History   Grav Para Term Preterm Abortions TAB SAB Ect Mult Living                 Review of Systems  HENT: Positive for sinus pressure.   Respiratory: Positive for cough.   All other systems reviewed and are negative.    Allergies  Review of patient's allergies indicates no known allergies.  Home Medications  No current outpatient prescriptions on file. BP 168/98  Pulse 71  Temp(Src) 97.8 F (36.6 C) (Oral)  Resp 16  SpO2 100%  LMP 03/18/2013 Physical Exam  Nursing note and vitals reviewed. Constitutional: She is oriented to person, place, and time. She appears well-developed and well-nourished.  HENT:  Head: Normocephalic.  Right Ear: External ear normal.  Left Ear: External ear normal.  Mouth/Throat:  Oropharynx is clear and moist.  No nasal discharge   Eyes: Conjunctivae are normal. Pupils are equal, round, and reactive to light.  Neck: Normal range of motion. Neck supple.  Cardiovascular: Normal rate and normal heart sounds.   Pulmonary/Chest: Effort normal and breath sounds normal. No respiratory distress. She has no wheezes. She has no rales.  Abdominal: Soft. Bowel sounds are normal. She exhibits no distension. There is no tenderness. There is no rebound and no guarding.  Musculoskeletal: Normal range of motion.  Neurological: She is alert and oriented to person, place, and time. No cranial nerve deficit. Coordination normal.  Skin: Skin is warm and dry.  Psychiatric: She has a normal mood and affect. Her behavior is normal. Judgment and thought content normal.    ED Course  Procedures (including critical care time) Labs Review Labs Reviewed - No data to display Imaging Review Dg Chest 2 View  04/01/2013   CLINICAL DATA:  Coughing up brown sputum, fever, flu-like symptoms for 1 week  EXAM: CHEST  2 VIEW  COMPARISON:  None.  FINDINGS: The heart size and vascular pattern are normal. There are bands of linear density in the right upper lobe that appear most consistent with scarring. There is a 7 mm right apical nodular opacity associated with this. Lungs are otherwise clear. No pleural effusions identified.  IMPRESSION: Evidence of right upper lobe scarring. 7 mm nodular opacity right apex. Recommend CT thorax to exclude mass.   Electronically Signed  By: Skipper Cliche M.D.   On: 04/01/2013 21:19    EKG Interpretation   None       MDM  No diagnosis found. Phebe Saliba is a 48 y.o. female here with nasal congestion, likely viral syndrome. Well appearing, will give mucinex and flonase for symptomatic relief. Xray showed upper lobe scarring, likely from previous TB. Also showed 7 mm nodule. I don't think she has active TB. Will refer her to wellness center to get repeat imaging or  CT.      Wandra Arthurs, MD 04/02/13 [phone]

## 2013-04-02 NOTE — Discharge Instructions (Signed)
Use flonase for congestion.   Take tylenol and motrin.   Stay hydrated.   Try mucinex over the counter.   You have a lung nodule. You need to see your doctor or wellness center to get it followed up.   Return to ER if you have fever, vomiting, dehydration, trouble breathing.

## 2013-05-13 ENCOUNTER — Ambulatory Visit (INDEPENDENT_AMBULATORY_CARE_PROVIDER_SITE_OTHER): Payer: BC Managed Care – PPO | Admitting: Family Medicine

## 2013-05-13 ENCOUNTER — Encounter: Payer: Self-pay | Admitting: Family Medicine

## 2013-05-13 VITALS — BP 124/94 | Temp 98.3°F | Ht 69.5 in | Wt 233.0 lb

## 2013-05-13 DIAGNOSIS — Z7689 Persons encountering health services in other specified circumstances: Secondary | ICD-10-CM

## 2013-05-13 DIAGNOSIS — Z7189 Other specified counseling: Secondary | ICD-10-CM

## 2013-05-13 DIAGNOSIS — R911 Solitary pulmonary nodule: Secondary | ICD-10-CM | POA: Insufficient documentation

## 2013-05-13 LAB — BASIC METABOLIC PANEL
BUN: 11 mg/dL (ref 6–23)
CALCIUM: 9.4 mg/dL (ref 8.4–10.5)
CO2: 22 meq/L (ref 19–32)
CREATININE: 0.6 mg/dL (ref 0.4–1.2)
Chloride: 106 mEq/L (ref 96–112)
GFR: 138.17 mL/min (ref >60.00)
Glucose, Bld: 78 mg/dL (ref 70–99)
Potassium: 3.7 mEq/L (ref 3.5–5.1)
Sodium: 136 mEq/L (ref 135–145)

## 2013-05-13 LAB — LIPID PANEL
CHOL/HDL RATIO: 2
Cholesterol: 123 mg/dL (ref 0–200)
HDL: 58.5 mg/dL (ref >39.00)
LDL CALC: 52 mg/dL (ref 0–99)
Triglycerides: 61 mg/dL (ref 0.0–149.0)
VLDL: 12.2 mg/dL (ref 0.0–40.0)

## 2013-05-13 LAB — HEMOGLOBIN A1C: HEMOGLOBIN A1C: 5.7 % (ref 4.6–6.5)

## 2013-05-13 NOTE — Progress Notes (Signed)
Chief Complaint  Patient presents with  . Establish Care  . Lung Lesion    HPI:  Olivia West is here to establish care. Had not had a primary care doctor in the past. Last PCP and physical: last physical and pap smear in 07-05-2009 - she reports pap normal at the time. Last mammo in 2011-07-06 normal.  Has the following chronic problems and concerns today:  Patient Active Problem List   Diagnosis Date Noted  . Solitary pulmonary nodule 05/13/2013   Incidental pulm nodule: -found on CXR when in ED 1/2 for flu like symptoms -cxr report notes 65mm R apical nodule with recs for CT to further evaluate -had TB in 07-06-86 that was treated completely and had CXR and testing since that was normal -denies: persistent cough, fevers, night sweats, weight loss, malaise, smoking hx, FH of  Lung cancer  HTN: -mild and reports on medication in the past, but felt tired on this medication so stopped - does not remember which medication  Health Maintenance: -needs physical with pap  ROS: See pertinent positives and negatives per HPI.  Past Medical History  Diagnosis Date  . Neck injury   . Hypertension   . Fracture of spine, cervical, without spinal cord injury, closed 05/31/2011    Family History  Problem Relation Age of Onset  . Breast cancer Sister 33    died in 07-05-08   . Hypertension Mother     History   Social History  . Marital Status: Married    Spouse Name: N/A    Number of Children: N/A  . Years of Education: N/A   Social History Main Topics  . Smoking status: Never Smoker   . Smokeless tobacco: None  . Alcohol Use: No  . Drug Use: No  . Sexual Activity: None   Other Topics Concern  . None   Social History Narrative   Work or [place] Situation: lives with husband      Spiritual Beliefs: Christian      Lifestyle: no regular exercise; diet is ok             No current outpatient prescriptions on file.  EXAMDanley Danker Vitals:   05/13/13  0946  BP: 124/94  Temp: 98.3 F (36.8 C)    Body mass index is 33.93 kg/(m^2).  GENERAL: vitals reviewed and listed above, alert, oriented, appears well hydrated and in no acute distress  HEENT: atraumatic, conjunttiva clear, no obvious abnormalities on inspection of external nose and ears  NECK: no obvious masses on inspection  LUNGS: clear to auscultation bilaterally, no wheezes, rales or rhonchi, good air movement  CV: HRRR, no peripheral edema  MS: moves all extremities without noticeable abnormality  PSYCH: pleasant and cooperative, no obvious depression or anxiety  ASSESSMENT AND PLAN:  Discussed the following assessment and plan:  Solitary pulmonary nodule - Plan: CT Chest Wo Contrast  Encounter to establish care - Plan: Lipid panel, Hemoglobin R5J, Basic metabolic panel   -We reviewed the PMH, PSH, FH, SH, Meds and Allergies. -We provided refills for any medications we will prescribe as needed. -We addressed current concerns per orders and patient instructions. -We have asked for records for pertinent exams, studies, vaccines and notes from previous providers. -We have advised patient to follow up per instructions below. -discussed workup risks/benefits for pulm nodule/CT ordered -labs today - FASTING -follow up for physical with pap  -Patient advised to return or notify a  doctor immediately if symptoms worsen or persist or new concerns arise.  Patient Instructions  -We placed a referral for you as discussed for the CT scan. It usually takes about 1-2 weeks to process and schedule this referral. If you have not heard from [place] regarding this appointment in 2 weeks please contact our office.  -please call to schedule your mammogram  -We have ordered labs or studies at this visit. It can take up to 1-2 weeks for results and processing. We will contact you with instructions IF your results are abnormal. Normal results will be released to your [facility]. If you have not  heard from [place] or can not find your results in [facility] in 2 weeks please contact our office.  -PLEASE SIGN UP FOR MYCHART TODAY   We recommend the following healthy lifestyle measures: - eat a healthy diet consisting of lots of vegetables, fruits, beans, nuts, seeds, healthy meats such as white chicken and fish and whole grains.  - avoid fried foods, fast food, processed foods, sodas, red meet and other fattening foods.  - get a least 150 minutes of aerobic exercise per week.   Follow up in: in next 1-2 months for your physical exam       Lucretia Kern.

## 2013-05-13 NOTE — Progress Notes (Signed)
Pre visit review using our clinic review tool, if applicable. No additional management support is needed unless otherwise documented below in the visit note. 

## 2013-05-13 NOTE — Patient Instructions (Signed)
-  We placed a referral for you as discussed for the CT scan. It usually takes about 1-2 weeks to process and schedule this referral. If you have not heard from [place] regarding this appointment in 2 weeks please contact our office.  -please call to schedule your mammogram  -We have ordered labs or studies at this visit. It can take up to 1-2 weeks for results and processing. We will contact you with instructions IF your results are abnormal. Normal results will be released to your [facility]. If you have not heard from [place] or can not find your results in [facility] in 2 weeks please contact our office.  -PLEASE SIGN UP FOR MYCHART TODAY   We recommend the following healthy lifestyle measures: - eat a healthy diet consisting of lots of vegetables, fruits, beans, nuts, seeds, healthy meats such as white chicken and fish and whole grains.  - avoid fried foods, fast food, processed foods, sodas, red meet and other fattening foods.  - get a least 150 minutes of aerobic exercise per week.   Follow up in: in next 1-2 months for your physical exam

## 2013-05-26 ENCOUNTER — Inpatient Hospital Stay: Admission: RE | Admit: 2013-05-26 | Payer: Self-pay | Source: Ambulatory Visit

## 2013-05-31 ENCOUNTER — Ambulatory Visit (INDEPENDENT_AMBULATORY_CARE_PROVIDER_SITE_OTHER)
Admission: RE | Admit: 2013-05-31 | Discharge: 2013-05-31 | Disposition: A | Discharge status: Home / Self Care | Payer: BC Managed Care – PPO | Source: Ambulatory Visit | Attending: Family Medicine | Admitting: Family Medicine

## 2013-05-31 DIAGNOSIS — R911 Solitary pulmonary nodule: Secondary | ICD-10-CM

## 2013-06-22 ENCOUNTER — Ambulatory Visit (INDEPENDENT_AMBULATORY_CARE_PROVIDER_SITE_OTHER): Payer: BC Managed Care – PPO | Admitting: Family Medicine

## 2013-06-22 ENCOUNTER — Encounter: Payer: Self-pay | Admitting: Family Medicine

## 2013-06-22 ENCOUNTER — Other Ambulatory Visit (HOSPITAL_COMMUNITY)
Admission: RE | Admit: 2013-06-22 | Discharge: 2013-06-22 | Disposition: A | Discharge status: Home / Self Care | Payer: BC Managed Care – PPO | Source: Ambulatory Visit | Attending: Family Medicine | Admitting: Family Medicine

## 2013-06-22 VITALS — BP 128/80 | Temp 99.1°F | Wt 238.0 lb

## 2013-06-22 DIAGNOSIS — Z01419 Encounter for gynecological examination (general) (routine) without abnormal findings: Secondary | ICD-10-CM | POA: Insufficient documentation

## 2013-06-22 DIAGNOSIS — Z1151 Encounter for screening for human papillomavirus (HPV): Secondary | ICD-10-CM | POA: Insufficient documentation

## 2013-06-22 DIAGNOSIS — Z0000 Encounter for general adult medical examination without abnormal findings: Secondary | ICD-10-CM

## 2013-06-22 NOTE — Progress Notes (Signed)
Chief Complaint  Patient presents with  . Annual Exam    [employer] [place] [employer]    HPI:  Here for CPE:  -Concerns today: none  -Diet: variety of foods, balance and well rounded  -Vit D, Calcium: no  -Exercise: no regular exercise  -Diabetes and Dyslipidemia Screening: done and ok  -Hx of HTN: no hx of treated HTN  -Vaccines: UTD  -pap history: reports last pap was 3 years ago, reports all paps normal in the past  -FDLMP: about 1 week ago  -sexual activity:  no new partners  -wants STI testing: no  -FH breast, colon or ovarian ca: see FH -reports had mammogram and ok and repeat in one year  -Alcohol, Tobacco, drug use: see social history  Review of Systems - Review of Systems  Constitutional: Negative for fever, weight loss and malaise/fatigue.  HENT: Negative for hearing loss.   Eyes: Negative for blurred vision and double vision.  Respiratory: Negative for cough and shortness of breath.   Cardiovascular: Negative for chest pain and palpitations.  Gastrointestinal: Negative for diarrhea and blood in stool.  Genitourinary: Negative for dysuria.  Musculoskeletal: Negative for falls.  Skin: Negative for rash.  Neurological: Negative for dizziness.  Endo/Heme/Allergies: Does not bruise/bleed easily.  Psychiatric/Behavioral: Negative for depression.     Past Medical History  Diagnosis Date  . Neck injury   . Hypertension   . Fracture of spine, cervical, without spinal cord injury, closed 05/31/2011    Past Surgical History  Procedure Laterality Date  . Posterior cervical fusion/foraminotomy  06/01/2011    Procedure: POSTERIOR CERVICAL FUSION/FORAMINOTOMY LEVEL 1;  Surgeon: Winfield Cunas, MD;  Location: [place] [facility];  Service: Neurosurgery;  Laterality: N/A;  Posterior Cervical Fusion Cervical Six-Seven  . Uterine fibroid surgery      for heavy menstrual bleeding    Family History  Problem Relation Age of Onset  . Breast cancer Sister 7    died in 07-17-2008   .  Hypertension Mother     History   Social History  . Marital Status: Married    Spouse Name: N/A    Number of Children: N/A  . Years of Education: N/A   Social History Main Topics  . Smoking status: Never Smoker   . Smokeless tobacco: None  . Alcohol Use: No  . Drug Use: No  . Sexual Activity: None   Other Topics Concern  . None   Social History Narrative   Work or [place] Situation: lives with husband      Spiritual Beliefs: Christian      Lifestyle: no regular exercise; diet is ok             No current outpatient prescriptions on file.  EXAM:  Filed Vitals:   06/22/13 1119  BP: 128/80  Temp: 99.1 F (37.3 C)    GENERAL: vitals reviewed and listed below, alert, oriented, appears well hydrated and in no acute distress  HEENT: head atraumatic, PERRLA, normal appearance of eyes, ears, nose and mouth. moist mucus membranes.  NECK: supple, no masses or lymphadenopathy  LUNGS: clear to auscultation bilaterally, no rales, rhonchi or wheeze  CV: HRRR, no peripheral edema or cyanosis, normal pedal pulses  BREAST: normal appearance - no lesions or discharge, on palpation normal breast tissue without any suspicious masses  ABDOMEN: bowel sounds normal, soft, non tender to palpation, no masses, no rebound or guarding  GU: normal appearance of external genitalia -  no lesions or masses, normal vaginal mucosa - no abnormal discharge, normal appearance of cervix - no lesions or abnormal discharge, on period - sm amount of blood   RECTAL: declined  SKIN: no rash or abnormal lesions  MS: normal gait, moves all extremities normally  NEURO: CN II-XII grossly intact, normal muscle strength and sensation to light touch on extremities  PSYCH: normal affect, pleasant and cooperative  ASSESSMENT AND PLAN:  Discussed the following assessment and plan:  Visit for preventive health examination - Plan: Cytology - PAP   -Discussed and  advised all [place] preventive services health task force level A and B recommendations for age, sex and risks.  -Advised at least 150 minutes of exercise per week and a healthy diet low in saturated fats and sweets and consisting of fresh fruits and vegetables, lean meats such as fish and white chicken and whole grains.  -pap obtained  -labs, studies and vaccines per orders this encounter  No orders of the defined types were placed in this encounter.    Patient Instructions  -vitamin D3 1000 IU daily, calcium 1200mg  total from diet and supplement daily  -We have ordered a pap smear. It can take up to 1-2 weeks for results and processing. We will contact you with instructions IF your results are abnormal. Normal results will be released to your [facility]. If you have not heard from [place] or can not find your results in [facility] in 2 weeks please contact our office.   We recommend the following healthy lifestyle measures: - eat a healthy diet consisting of lots of vegetables, fruits, beans, nuts, seeds, healthy meats such as white chicken and fish and whole grains.  - avoid fried foods, fast food, processed foods, sodas, red meet and other fattening foods.  - get a least 150 minutes of aerobic exercise per week.   Follow up in: 1 year or as needed     Patient advised to return to clinic immediately if symptoms worsen or persist or new concerns.  @LIFEPLAN @  No Follow-up on file.  Colin Benton R.

## 2013-06-22 NOTE — Patient Instructions (Signed)
-  vitamin D3 1000 IU daily, calcium 1200mg  total from diet and supplement daily  -We have ordered a pap smear. It can take up to 1-2 weeks for results and processing. We will contact you with instructions IF your results are abnormal. Normal results will be released to your [facility]. If you have not heard from [place] or can not find your results in [facility] in 2 weeks please contact our office.   We recommend the following healthy lifestyle measures: - eat a healthy diet consisting of lots of vegetables, fruits, beans, nuts, seeds, healthy meats such as white chicken and fish and whole grains.  - avoid fried foods, fast food, processed foods, sodas, red meet and other fattening foods.  - get a least 150 minutes of aerobic exercise per week.   Follow up in: 1 year or as needed

## 2013-06-22 NOTE — Progress Notes (Signed)
Pre visit review using our clinic review tool, if applicable. No additional management support is needed unless otherwise documented below in the visit note. 

## 2013-06-23 ENCOUNTER — Encounter: Payer: Self-pay | Admitting: Family Medicine

## 2014-04-26 ENCOUNTER — Ambulatory Visit (INDEPENDENT_AMBULATORY_CARE_PROVIDER_SITE_OTHER): Payer: BLUE CROSS/BLUE SHIELD | Admitting: Family Medicine

## 2014-04-26 ENCOUNTER — Encounter: Payer: Self-pay | Admitting: Family Medicine

## 2014-04-26 VITALS — BP 142/98 | HR 87 | Temp 98.5°F | Ht 68.5 in | Wt 242.8 lb

## 2014-04-26 DIAGNOSIS — Z23 Encounter for immunization: Secondary | ICD-10-CM

## 2014-04-26 DIAGNOSIS — I10 Essential (primary) hypertension: Secondary | ICD-10-CM

## 2014-04-26 LAB — BASIC METABOLIC PANEL
BUN: 7 mg/dL (ref 6–23)
CHLORIDE: 104 meq/L (ref 96–112)
CO2: 26 meq/L (ref 19–32)
CREATININE: 0.72 mg/dL (ref 0.40–1.20)
Calcium: 9.4 mg/dL (ref 8.4–10.5)
GFR: 111.49 mL/min (ref >60.00)
Glucose, Bld: 89 mg/dL (ref 70–99)
POTASSIUM: 4.2 meq/L (ref 3.5–5.1)
SODIUM: 135 meq/L (ref 135–145)

## 2014-04-26 LAB — LIPID PANEL
Cholesterol: 128 mg/dL (ref 0–200)
HDL: 62.9 mg/dL (ref >39.00)
LDL CALC: 42 mg/dL (ref 0–99)
NonHDL: 65.1
Total CHOL/HDL Ratio: 2
Triglycerides: 114 mg/dL (ref 0.0–149.0)
VLDL: 22.8 mg/dL (ref 0.0–40.0)

## 2014-04-26 MED ORDER — HYDROCHLOROTHIAZIDE 25 MG PO TABS: 25.0000 mg | ORAL_TABLET | Freq: Every day | ORAL | Status: DC

## 2014-04-26 NOTE — Addendum Note (Signed)
Addended by: Agnes Lawrence on: 04/26/2014 08:46 AM   Modules accepted: Orders

## 2014-04-26 NOTE — Patient Instructions (Signed)
BEFORE YOU LEAVE: -labs -schedule follow up in 1 month -flu vaccine  For the blood pressure: -avoid salt in the diet -get at least 150-300 minutes of cardiovascular exercise per week -start the hydrochlorthiazide and take once every day in the morning, do not miss doses  We recommend the following healthy lifestyle measures: - eat a healthy diet consisting of lots of vegetables, fruits, beans, nuts, seeds, healthy meats such as white chicken and fish and whole grains.  - avoid fried foods, fast food, processed foods, sodas, red meet and other fattening foods.  - get a least 150 minutes of aerobic exercise per week.

## 2014-04-26 NOTE — Progress Notes (Signed)
HPI:  Acute visit for:  1) Elevated BP: -wants to recheck as runs high occasionally when eats a lot of sodium -denies: CP, SOB, DOE, swelling -reports took a BP medication in the past but does not remember what, then BP was ok for a long time and was not on medication -FH HTN in mother, sister and brother  2) Wants to check basic labs and wants letter of general medical condition for adoption process. Does not know what labs she needs. Wants to get cholesterol and BMP.  ROS: See pertinent positives and negatives per HPI.  Past Medical History  Diagnosis Date  . Neck injury   . Hypertension   . Fracture of spine, cervical, without spinal cord injury, closed 05/31/2011    Past Surgical History  Procedure Laterality Date  . Posterior cervical fusion/foraminotomy  06/01/2011    Procedure: POSTERIOR CERVICAL FUSION/FORAMINOTOMY LEVEL 1;  Surgeon: Winfield Cunas, MD;  Location: [facility];  Service: Neurosurgery;  Laterality: N/A;  Posterior Cervical Fusion Cervical Six-Seven  . Uterine fibroid surgery      for heavy menstrual bleeding    Family History  Problem Relation Age of Onset  . Breast cancer Sister 60    died in 07/02/2008   . Hypertension Mother     History   Social History  . Marital Status: Married    Spouse Name: N/A    Number of Children: N/A  . Years of Education: N/A   Social History Main Topics  . Smoking status: Never Smoker   . Smokeless tobacco: None  . Alcohol Use: No  . Drug Use: No  . Sexual Activity: None   Other Topics Concern  . None   Social History Narrative   Work or [place] Situation: lives with husband      Spiritual Beliefs: Christian      Lifestyle: no regular exercise; diet is ok              Current outpatient prescriptions:  .  hydrochlorothiazide (HYDRODIURIL) 25 MG tablet, Take 1 tablet (25 mg total) by mouth daily., Disp: 30 tablet, Rfl: 3  EXAM:  Filed Vitals:   04/26/14 0813  BP:  142/98  Pulse: 87  Temp: 98.5 F (36.9 C)    Body mass index is 36.38 kg/(m^2).  GENERAL: vitals reviewed and listed above, alert, oriented, appears well hydrated and in no acute distress  HEENT: atraumatic, conjunttiva clear, no obvious abnormalities on inspection of external nose and ears  NECK: no obvious masses on inspection  LUNGS: clear to auscultation bilaterally, no wheezes, rales or rhonchi, good air movement  CV: HRRR, no peripheral edema  MS: moves all extremities without noticeable abnormality  PSYCH: pleasant and cooperative, no obvious depression or anxiety  ASSESSMENT AND PLAN:  Discussed the following assessment and plan:  Essential hypertension - Plan: hydrochlorothiazide (HYDRODIURIL) 25 MG tablet, Basic metabolic panel, Lipid Panel  -will write letter -basic labs per her request -discussed risks/benefits tx for BP - starting hctz -follow up 1 month -Patient advised to return or notify a doctor immediately if symptoms worsen or persist or new concerns arise.  Patient Instructions  BEFORE YOU LEAVE: -labs -schedule follow up in 1 month -flu vaccine  For the blood pressure: -avoid salt in the diet -get at least 150-300 minutes of cardiovascular exercise per week -start the hydrochlorthiazide and take once every day in the morning, do not miss doses  We recommend the  following healthy lifestyle measures: - eat a healthy diet consisting of lots of vegetables, fruits, beans, nuts, seeds, healthy meats such as white chicken and fish and whole grains.  - avoid fried foods, fast food, processed foods, sodas, red meet and other fattening foods.  - get a least 150 minutes of aerobic exercise per week.       Colin Benton R.

## 2014-04-26 NOTE — Progress Notes (Signed)
Pre visit review using our clinic review tool, if applicable. No additional management support is needed unless otherwise documented below in the visit note. 

## 2014-04-27 ENCOUNTER — Encounter: Payer: Self-pay | Admitting: *Deleted

## 2014-04-28 ENCOUNTER — Telehealth: Payer: Self-pay | Admitting: Family Medicine

## 2014-04-28 NOTE — Telephone Encounter (Signed)
emmi mailed  °

## 2014-05-01 ENCOUNTER — Telehealth: Payer: Self-pay | Admitting: Family Medicine

## 2014-05-01 NOTE — Telephone Encounter (Signed)
Advise trial norvasc 5 mg instead. Please send #[phone], 1 refill and advise she follow up as planned. STOP the hctz.

## 2014-05-01 NOTE — Telephone Encounter (Signed)
Patient states hydrochlorothiazide (HYDRODIURIL) 25 MG tablet caused heart palpitations, shortness of breath and fatigue.  She would like a different rx sent in.   [id number] - [place], [address] [place]

## 2014-05-02 MED ORDER — AMLODIPINE BESYLATE 5 MG PO TABS: 5.0000 mg | ORAL_TABLET | Freq: Every day | ORAL | Status: DC

## 2014-05-02 NOTE — Telephone Encounter (Signed)
Rx done and I called the pt and informed her of the message below. 

## 2014-05-26 ENCOUNTER — Encounter: Payer: Self-pay | Admitting: Family Medicine

## 2014-05-26 ENCOUNTER — Ambulatory Visit (INDEPENDENT_AMBULATORY_CARE_PROVIDER_SITE_OTHER): Payer: BLUE CROSS/BLUE SHIELD | Admitting: Family Medicine

## 2014-05-26 VITALS — BP 126/88 | HR 87 | Temp 97.9°F | Ht 68.5 in | Wt 245.6 lb

## 2014-05-26 DIAGNOSIS — I10 Essential (primary) hypertension: Secondary | ICD-10-CM

## 2014-05-26 NOTE — Progress Notes (Signed)
  HPI:  Follow up:  HTN: -started norvasc 5 mg 05/01/14 -she did not tolerate hctz (caused palpitations, SOB and fatigue) -reports: taking the norvasc daily -no regular exercise; working on eating a healthy diet -denies: CP, SOB, DOE, swelling  HIV screening: offered - declined  ROS: See pertinent positives and negatives per HPI.  Past Medical History  Diagnosis Date  . Neck injury   . Hypertension   . Fracture of spine, cervical, without spinal cord injury, closed 05/31/2011    Past Surgical History  Procedure Laterality Date  . Posterior cervical fusion/foraminotomy  06/01/2011    Procedure: POSTERIOR CERVICAL FUSION/FORAMINOTOMY LEVEL 1;  Surgeon: Winfield Cunas, MD;  Location: [place] [facility];  Service: Neurosurgery;  Laterality: N/A;  Posterior Cervical Fusion Cervical Six-Seven  . Uterine fibroid surgery      for heavy menstrual bleeding    Family History  Problem Relation Age of Onset  . Breast cancer Sister 70    died in 07/12/08   . Hypertension Mother     History   Social History  . Marital Status: Married    Spouse Name: N/A  . Number of Children: N/A  . Years of Education: N/A   Social History Main Topics  . Smoking status: Never Smoker   . Smokeless tobacco: Not on file  . Alcohol Use: No  . Drug Use: No  . Sexual Activity: Not on file   Other Topics Concern  . None   Social History Narrative   Work or [place] Situation: lives with husband      Spiritual Beliefs: Christian      Lifestyle: no regular exercise; diet is ok              Current outpatient prescriptions:  .  amLODipine (NORVASC) 5 MG tablet, Take 1 tablet (5 mg total) by mouth daily., Disp: 30 tablet, Rfl: 1  EXAM:  Filed Vitals:   05/26/14 1306  BP: 126/88  Pulse: 87  Temp: 97.9 F (36.6 C)    Body mass index is 36.8 kg/(m^2).  GENERAL: vitals reviewed and listed above, alert, oriented, appears well hydrated and in no acute  distress  HEENT: atraumatic, conjunttiva clear, no obvious abnormalities on inspection of external nose and ears  NECK: no obvious masses on inspection  LUNGS: clear to auscultation bilaterally, no wheezes, rales or rhonchi, good air movement  CV: HRRR, no peripheral edema  MS: moves all extremities without noticeable abnormality  PSYCH: pleasant and cooperative, no obvious depression or anxiety  ASSESSMENT AND PLAN:  Discussed the following assessment and plan:  Essential hypertension  -opted to stick to current medication and add in exercise -follow up in 3 months -Patient advised to return or notify a doctor immediately if symptoms worsen or persist or new concerns arise.  Patient Instructions  BEFORE YOU LEAVE: -schedule follow up in 3 month  We recommend the following healthy lifestyle measures: - eat a healthy diet consisting of lots of vegetables, fruits, beans, nuts, seeds, healthy meats such as white chicken and fish and whole grains.  - avoid fried foods, fast food, processed foods, sodas, red meet and other fattening foods.  - get a least 150 minutes of aerobic exercise per week.       Colin Benton R.

## 2014-05-26 NOTE — Patient Instructions (Signed)
BEFORE YOU LEAVE: -schedule follow up in 3 month  We recommend the following healthy lifestyle measures: - eat a healthy diet consisting of lots of vegetables, fruits, beans, nuts, seeds, healthy meats such as white chicken and fish and whole grains.  - avoid fried foods, fast food, processed foods, sodas, red meet and other fattening foods.  - get a least 150 minutes of aerobic exercise per week.

## 2014-05-26 NOTE — Progress Notes (Signed)
Pre visit review using our clinic review tool, if applicable. No additional management support is needed unless otherwise documented below in the visit note. 

## 2014-07-10 ENCOUNTER — Other Ambulatory Visit: Payer: Self-pay | Admitting: Family Medicine

## 2014-08-25 ENCOUNTER — Ambulatory Visit: Payer: BLUE CROSS/BLUE SHIELD | Admitting: Family Medicine

## 2014-09-15 ENCOUNTER — Ambulatory Visit: Payer: BLUE CROSS/BLUE SHIELD | Admitting: Family Medicine

## 2014-10-15 ENCOUNTER — Other Ambulatory Visit: Payer: Self-pay | Admitting: Family Medicine

## 2014-12-04 ENCOUNTER — Other Ambulatory Visit: Payer: Self-pay | Admitting: Family Medicine

## 2015-01-19 ENCOUNTER — Other Ambulatory Visit: Payer: Self-pay | Admitting: Family Medicine

## 2015-03-10 ENCOUNTER — Other Ambulatory Visit: Payer: Self-pay | Admitting: Family Medicine

## 2015-04-28 ENCOUNTER — Other Ambulatory Visit: Payer: Self-pay | Admitting: Family Medicine

## 2015-06-23 ENCOUNTER — Other Ambulatory Visit: Payer: Self-pay | Admitting: Family Medicine

## 2015-07-26 ENCOUNTER — Other Ambulatory Visit: Payer: Self-pay | Admitting: Family Medicine

## 2015-08-29 ENCOUNTER — Other Ambulatory Visit: Payer: Self-pay | Admitting: Family Medicine

## 2015-10-06 ENCOUNTER — Other Ambulatory Visit: Payer: Self-pay | Admitting: Family Medicine

## 2015-10-23 IMAGING — CT CT CHEST W/O CM
2 of 4 series · 15 of 36 positions shown, 18 images · IV contrast (Omnipaque 300)
Comparison: Chest radiograph April 01, 2013

CLINICAL DATA: Pulmonary nodular lesion on prior chest radiograph

EXAM:
CT CHEST WITHOUT CONTRAST
TECHNIQUE: Multidetector CT imaging of the chest was performed following the
standard protocol without IV contrast.

[Series 2: chest routine with · axial · 0.73mm/px · z∈[-290,-24]mm · 12 of 63 slices shown, 15 images]
[im 5/63  mediastinal]
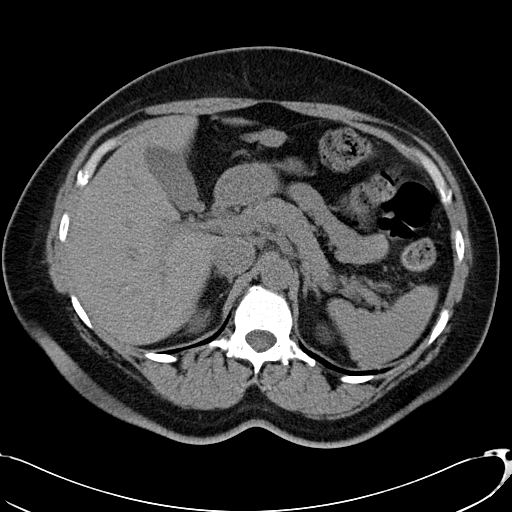
[im 5/63  lung]
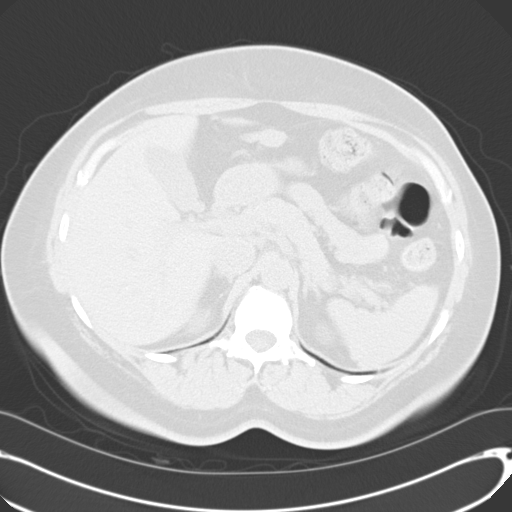
[im 10/63  lung]
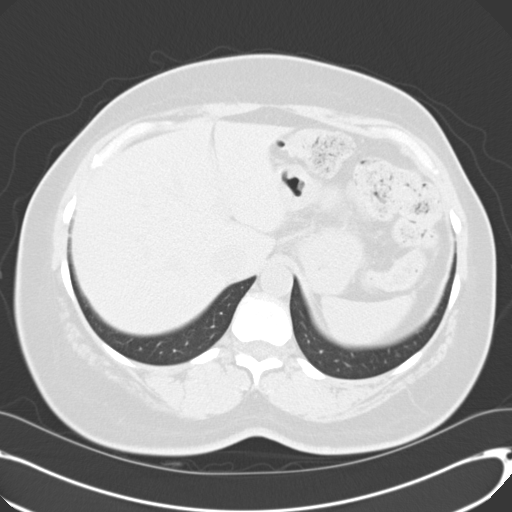
[im 15/63  lung]
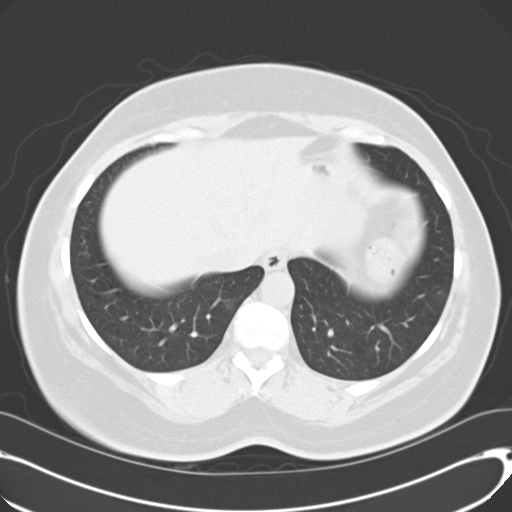
[im 20/63  lung]
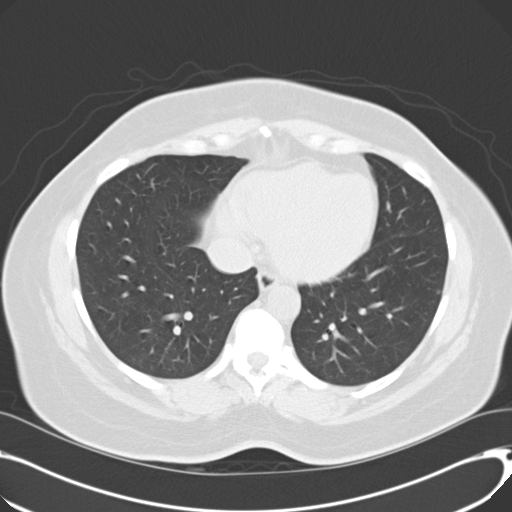
[im 24/63  mediastinal]
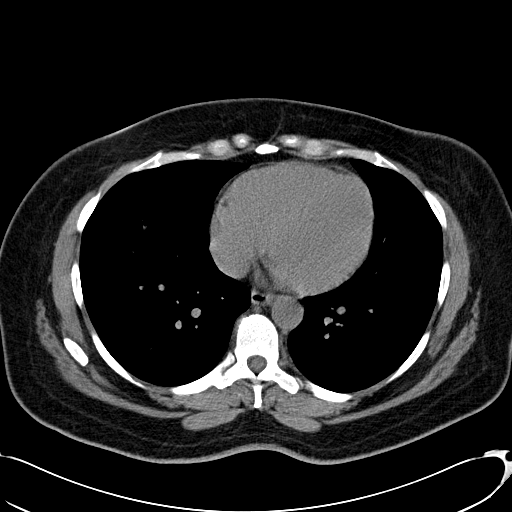
[im 24/63  lung]
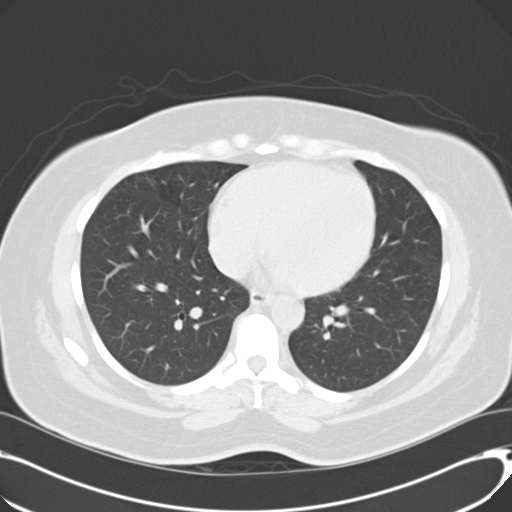
[im 29/63  lung]
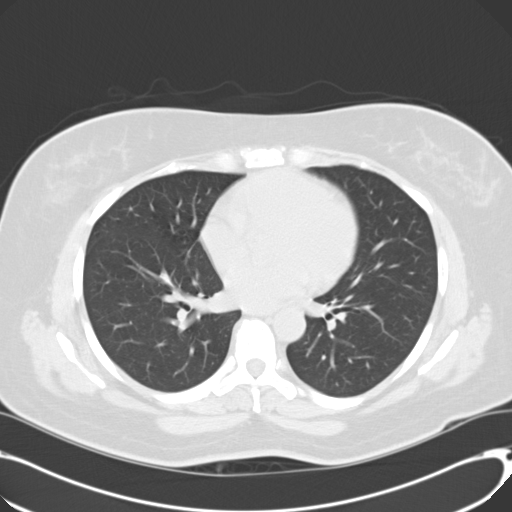
[im 34/63  lung]
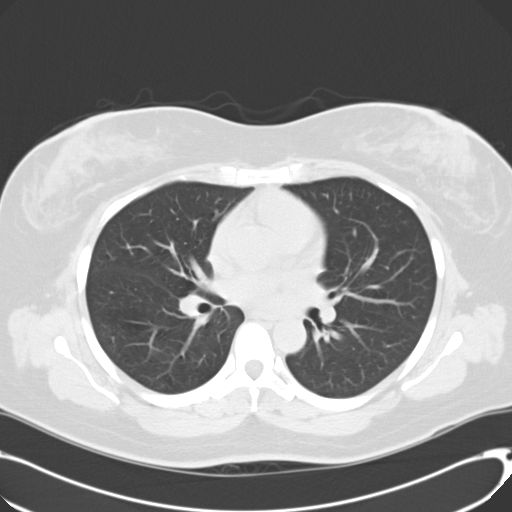
[im 39/63  lung]
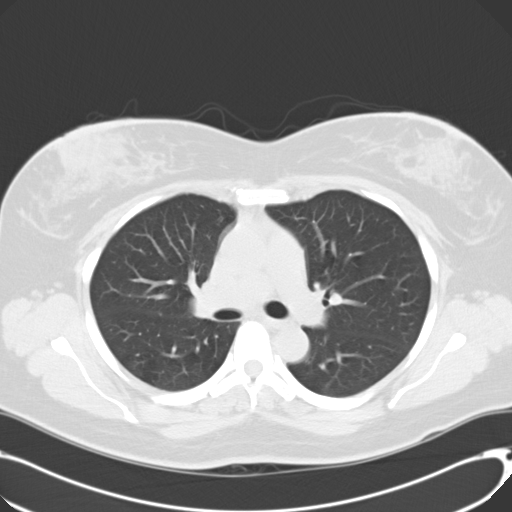
[im 43/63  mediastinal]
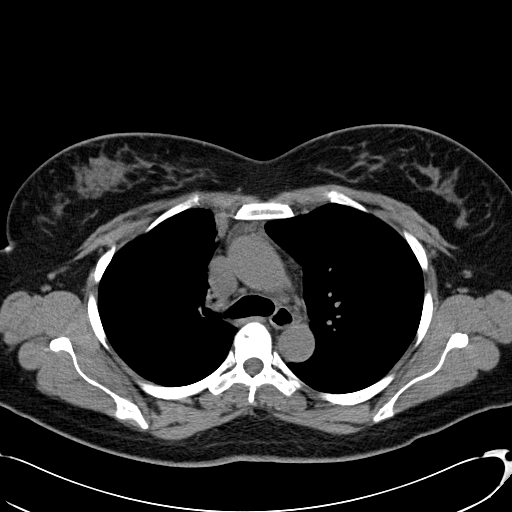
[im 43/63  lung]
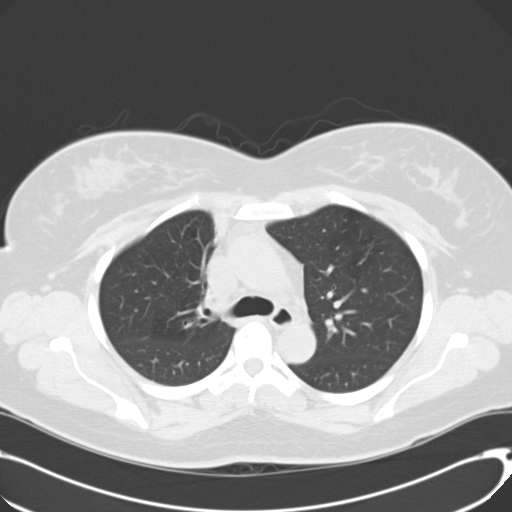
[im 48/63  lung]
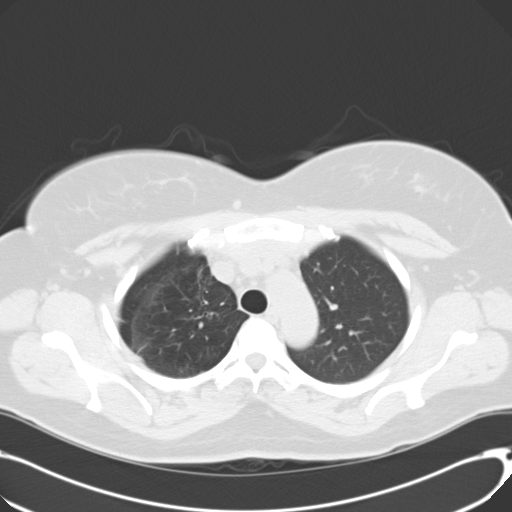
[im 53/63  lung]
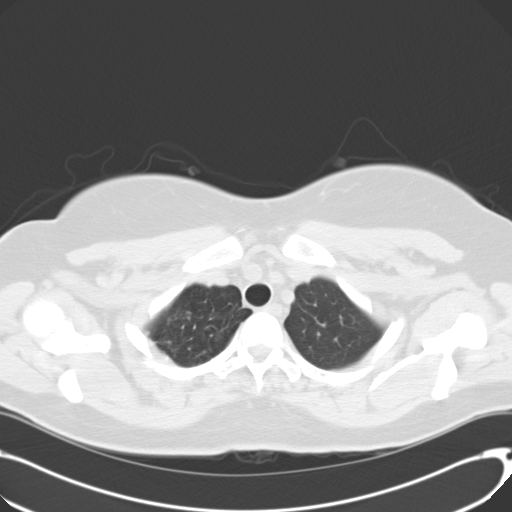
[im 58/63  lung]
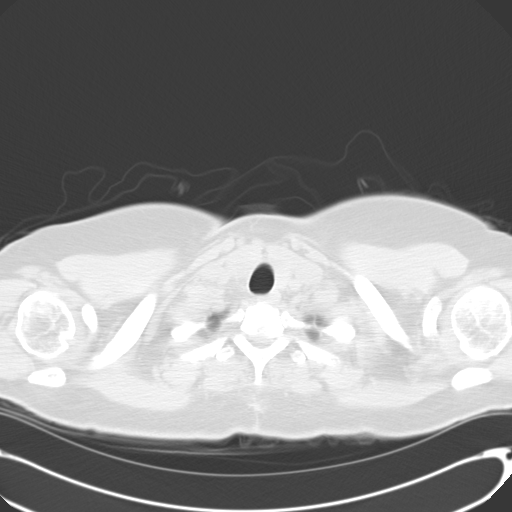

[Series 602: cor · coronal · 0.73mm/px · 3 of 98 slices shown]
[im 20/98  lung]
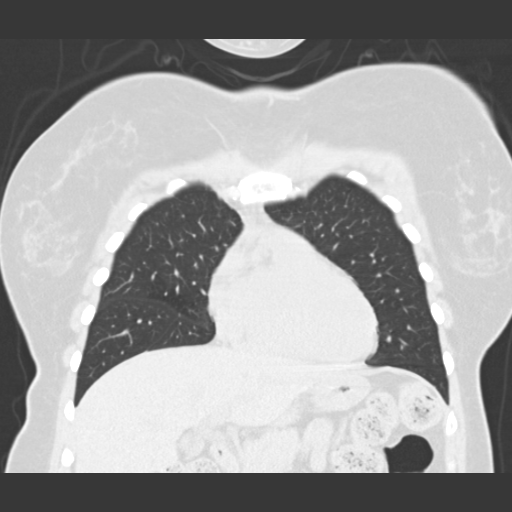
[im 39/98  lung]
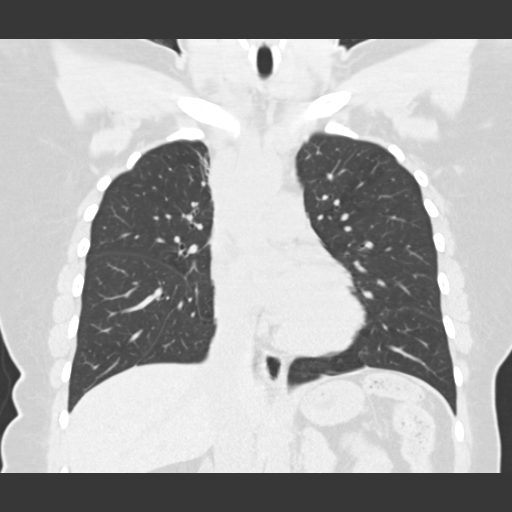
[im 59/98  lung]
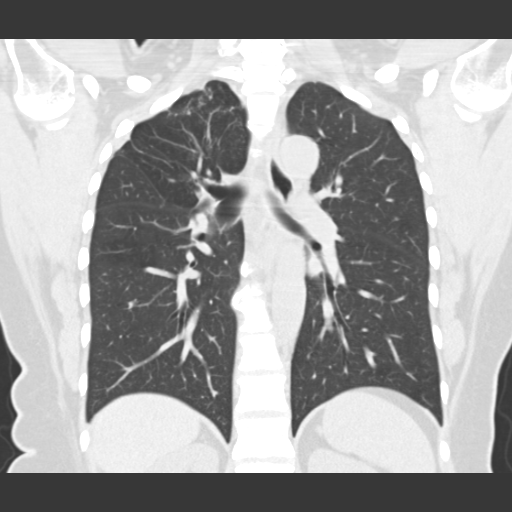

[15 of 36 positions shown; findings below may reference images not displayed]

FINDINGS: There is scarring in the right apex region in the area of the
questionable nodular lesions seen on chest radiograph. A
well-defined pulmonary nodule is not seen by CT in this area.
Elsewhere, lungs are clear.

There is no appreciable thoracic adenopathy. The pericardium is not
thickened.

Visualized upper abdominal structures appear unremarkable. There are
no appreciable blastic or lytic bone lesions. There is postoperative
change at the cervicothoracic junction. Thyroid appears normal.
IMPRESSION: Scarring in right apex but no pulmonary nodular lesion apparent.

Elsewhere lungs are clear.  No demonstrable adenopathy.

## 2015-11-08 ENCOUNTER — Other Ambulatory Visit: Payer: Self-pay | Admitting: Family Medicine

## 2015-11-23 ENCOUNTER — Ambulatory Visit (INDEPENDENT_AMBULATORY_CARE_PROVIDER_SITE_OTHER): Payer: 59 | Admitting: Family Medicine

## 2015-11-23 ENCOUNTER — Encounter: Payer: Self-pay | Admitting: Family Medicine

## 2015-11-23 VITALS — BP 150/92 | HR 91 | Temp 99.4°F | Ht 68.5 in | Wt 243.3 lb

## 2015-11-23 DIAGNOSIS — Z23 Encounter for immunization: Secondary | ICD-10-CM

## 2015-11-23 DIAGNOSIS — I10 Essential (primary) hypertension: Secondary | ICD-10-CM

## 2015-11-23 DIAGNOSIS — R600 Localized edema: Secondary | ICD-10-CM | POA: Diagnosis not present

## 2015-11-23 MED ORDER — AMLODIPINE BESYLATE 5 MG PO TABS: 5.0000 mg | ORAL_TABLET | Freq: Every day | ORAL | 0 refills | Status: DC

## 2015-11-23 NOTE — Patient Instructions (Addendum)
BEFORE YOU LEAVE: -follow up: in next 3 months for PHYSICAL, morning appointment so can do FASTING labs  Schedule mammogram.  Restart your blood pressure medication and take daily.  Compression socks for edema. Medium grade.  We recommend the following healthy lifestyle: 1) Small portions - eat off of salad plate instead of dinner plate 2) Eat a healthy clean diet with avoidance of (less then 1 serving per week) processed foods, sweetened drinks, white starches, red meat, fast foods and sweets and consisting of: * 5-9 servings per day of fresh or frozen fruits and vegetables (not corn or potatoes, not dried or canned) *nuts and seeds, beans *olives and olive oil *small portions of lean meats such as fish and white chicken  *small portions of whole grains 3)Get at least 150 minutes of sweaty aerobic exercise per week 4)reduce stress - counseling, meditation, relaxation to balance other aspects of your life

## 2015-11-23 NOTE — Progress Notes (Signed)
Pre visit review using our clinic review tool, if applicable. No additional management support is needed unless otherwise documented below in the visit note. 

## 2015-11-23 NOTE — Progress Notes (Signed)
HPI:  Follow up:  HTN: -not seen in some time -reports ran out of BP med (norvasc) month -denies: CP, SOB, DOE, HA, Vision changes  Chronic swelling in both ankles: -not worse with medication, unchanged of BP medication   ROS: See pertinent positives and negatives per HPI.  Past Medical History:  Diagnosis Date  . Fracture of spine, cervical, without spinal cord injury, closed ([place]) 05/31/2011  . Hypertension   . Neck injury     Past Surgical History:  Procedure Laterality Date  . POSTERIOR CERVICAL FUSION/FORAMINOTOMY  06/01/2011   Procedure: POSTERIOR CERVICAL FUSION/FORAMINOTOMY LEVEL 1;  Surgeon: Winfield Cunas, MD;  Location: [place] [facility];  Service: Neurosurgery;  Laterality: N/A;  Posterior Cervical Fusion Cervical Six-Seven  . UTERINE FIBROID SURGERY     for heavy menstrual bleeding    Family History  Problem Relation Age of Onset  . Breast cancer Sister 39    died in 2008/07/25   . Hypertension Mother     Social History   Social History  . Marital status: Married    Spouse name: N/A  . Number of children: N/A  . Years of education: N/A   Social History Main Topics  . Smoking status: Never Smoker  . Smokeless tobacco: None  . Alcohol use No  . Drug use: No  . Sexual activity: Not Asked   Other Topics Concern  . None   Social History Narrative   Work or [place] Situation: lives with husband      Spiritual Beliefs: Christian      Lifestyle: no regular exercise; diet is ok              Current Outpatient Prescriptions:  .  amLODipine (NORVASC) 5 MG tablet, Take 1 tablet (5 mg total) by mouth daily., Disp: 90 tablet, Rfl: 0  EXAM:  Vitals:   11/23/15 1411  BP: (!) 150/92  Pulse: 91  Temp: 99.4 F (37.4 C)    Body mass index is 36.46 kg/m.  GENERAL: vitals reviewed and listed above, alert, oriented, appears well hydrated and in no acute distress  HEENT: atraumatic, conjunttiva clear, no obvious  abnormalities on inspection of external nose and ears  NECK: no obvious masses on inspection  LUNGS: clear to auscultation bilaterally, no wheezes, rales or rhonchi, good air movement  CV: HRRR, tr bilat ankle peripheral edema  MS: moves all extremities without noticeable abnormality  PSYCH: pleasant and cooperative, no obvious depression or anxiety  ASSESSMENT AND PLAN:  Discussed the following assessment and plan:  Essential hypertension  Bilateral edema of lower extremity  Encounter for immunization - Plan: Flu Vaccine QUAD 36+ mos IM  -refilled BP med -compression and elevation advised for legs, consider change in med if worsens, though not better off med or on -compression and elevation for mild edema -advised to schedule mammo and physical - will plan to check labs then -Patient advised to return or notify a doctor immediately if symptoms worsen or persist or new concerns arise.  Patient Instructions  BEFORE YOU LEAVE: -follow up: in next 3 months for PHYSICAL, morning appointment so can do FASTING labs  Schedule mammogram.  Restart your blood pressure medication and take daily.  Compression socks for edema. Medium grade.  We recommend the following healthy lifestyle: 1) Small portions - eat off of salad plate instead of dinner plate 2) Eat a healthy clean diet with avoidance of (less then 1 serving per week)  processed foods, sweetened drinks, white starches, red meat, fast foods and sweets and consisting of: * 5-9 servings per day of fresh or frozen fruits and vegetables (not corn or potatoes, not dried or canned) *nuts and seeds, beans *olives and olive oil *small portions of lean meats such as fish and white chicken  *small portions of whole grains 3)Get at least 150 minutes of sweaty aerobic exercise per week 4)reduce stress - counseling, meditation, relaxation to balance other aspects of your life     Olivia West R., DO

## 2016-02-01 ENCOUNTER — Encounter: Payer: Self-pay | Admitting: Family Medicine

## 2016-02-08 ENCOUNTER — Encounter: Payer: Self-pay | Admitting: Family Medicine

## 2016-02-08 ENCOUNTER — Ambulatory Visit (INDEPENDENT_AMBULATORY_CARE_PROVIDER_SITE_OTHER): Payer: 59 | Admitting: Family Medicine

## 2016-02-08 VITALS — BP 140/90 | HR 86 | Temp 98.6°F | Ht 68.75 in | Wt 243.8 lb

## 2016-02-08 DIAGNOSIS — R911 Solitary pulmonary nodule: Secondary | ICD-10-CM

## 2016-02-08 DIAGNOSIS — I10 Essential (primary) hypertension: Secondary | ICD-10-CM | POA: Diagnosis not present

## 2016-02-08 DIAGNOSIS — E6609 Other obesity due to excess calories: Secondary | ICD-10-CM | POA: Diagnosis not present

## 2016-02-08 DIAGNOSIS — Z0000 Encounter for general adult medical examination without abnormal findings: Secondary | ICD-10-CM

## 2016-02-08 DIAGNOSIS — IMO0001 Reserved for inherently not codable concepts without codable children: Secondary | ICD-10-CM

## 2016-02-08 DIAGNOSIS — R899 Unspecified abnormal finding in specimens from other organs, systems and tissues: Secondary | ICD-10-CM | POA: Diagnosis not present

## 2016-02-08 DIAGNOSIS — Z6835 Body mass index (BMI) 35.0-35.9, adult: Secondary | ICD-10-CM

## 2016-02-08 HISTORY — DX: Reserved for inherently not codable concepts without codable children: IMO0001

## 2016-02-08 LAB — CBC
HEMATOCRIT: 30.7 % — AB (ref 36.0–46.0)
Hemoglobin: 9.6 g/dL — ABNORMAL LOW (ref 12.0–15.0)
MCHC: 31.3 g/dL (ref 30.0–36.0)
MCV: 68.7 fl — AB (ref 78.0–100.0)
Platelets: 363 10*3/uL (ref 150.0–400.0)
RBC: 4.47 Mil/uL (ref 3.87–5.11)
RDW: 20 % — ABNORMAL HIGH (ref 11.5–15.5)
WBC: 3.9 10*3/uL — ABNORMAL LOW (ref 4.0–10.5)

## 2016-02-08 LAB — BASIC METABOLIC PANEL
BUN: 12 mg/dL (ref 6–23)
CHLORIDE: 106 meq/L (ref 96–112)
CO2: 26 meq/L (ref 19–32)
CREATININE: 0.68 mg/dL (ref 0.40–1.20)
Calcium: 9.2 mg/dL (ref 8.4–10.5)
GFR: 118.2 mL/min (ref >60.00)
Glucose, Bld: 80 mg/dL (ref 70–99)
POTASSIUM: 4.6 meq/L (ref 3.5–5.1)
Sodium: 138 mEq/L (ref 135–145)

## 2016-02-08 LAB — LIPID PANEL
Cholesterol: 125 mg/dL (ref 0–200)
HDL: 63.7 mg/dL (ref >39.00)
LDL Cholesterol: 51 mg/dL (ref 0–99)
NonHDL: 61.2
TRIGLYCERIDES: 49 mg/dL (ref 0.0–149.0)
Total CHOL/HDL Ratio: 2
VLDL: 9.8 mg/dL (ref 0.0–40.0)

## 2016-02-08 LAB — HEMOGLOBIN A1C: HEMOGLOBIN A1C: 5.6 % (ref 4.6–6.5)

## 2016-02-08 NOTE — Patient Instructions (Signed)
BEFORE YOU LEAVE: -follow up: 1 month for hypertension  We have ordered labs or studies at this visit. It can take up to 1-2 weeks for results and processing. IF results require follow up or explanation, we will call you with instructions. Clinically stable results will be released to your [facility]. If you have not heard from [place] or cannot find your results in [facility] in 2 weeks please contact our office at [phone].   We recommend the following healthy lifestyle for LIFE: 1) Small portions.   Tip: eat off of a salad plate instead of a dinner plate.  Tip: It is ok to feel hungry after a meal - that likely means you ate an appropriate portion.  Tip: if you need more or a snack choose fruits, veggies and/or a handful of nuts or seeds.  2) Eat a healthy clean diet.  * Tip: Avoid (less then 1 serving per week): processed foods, sweets, sweetened drinks, white starches (rice, flour, bread, potatoes, pasta, etc), red meat, fast foods, butter  *Tip: CHOOSE instead   * 5-9 servings per day of fresh or frozen fruits and vegetables (but not corn, potatoes, bananas, canned or dried fruit)   *nuts and seeds, beans   *olives and olive oil   *small portions of lean meats such as fish and white chicken    *small portions of whole grains  3)Get at least 150 minutes of sweaty aerobic exercise per week.  4)Reduce stress - consider counseling, meditation and relaxation to balance other aspects of your life.   If you are not yet signed up for [facility], please SIGN UP TODAY. We now offer online scheduling, same day appointments and extended hours. WHEN YOU DON'T FEEL YOUR BEST.Marland KitchenMarland KitchenWE ARE HERE TO HELP.

## 2016-02-08 NOTE — Progress Notes (Signed)
HPI:  Here for CPE:  -Concerns and/or follow up today:  HTN: -meds: norvasc  -some chronic mild ankle edema not worsened by CCB -denies: cp, sob, doe, worsening swelling  Obesity: -wt last visit 243 (10/2015) --> 243 today -Diet: reports diet poor -Exercise: no regular exercise  -Taking folic acid, vitamin D or calcium: no  -Diabetes and Dyslipidemia Screening: fasting for labs today  -Vaccines: UTD  -pap history: 26-Jul-2013, normal, hpv neg  -sexual activity: yes, female partner, no new partners  -wants STI testing (Hep C if born [birth year]): no  -FH breast, colon or ovarian ca: see FH Last mammogram: [facility], 07/26/13 BIRADS 1; reports had repeat mammo with callback done today and told normal with 1 year repeat advised Last colon cancer screening: n/a  Breast Ca Risk Assessment: -sees personal and family hx - she agrees to yearly mammograms and is seeing breast center specialist  -Alcohol, Tobacco, drug use: see social history  Review of Systems - no fevers, unintentional weight loss, vision loss, hearing loss, chest pain, sob, hemoptysis, melena, hematochezia, hematuria, genital discharge, changing or concerning skin lesions, bleeding, bruising, loc, thoughts of self harm or SI  Past Medical History:  Diagnosis Date  . Fracture of spine, cervical, without spinal cord injury, closed ([place]) 05/31/2011  . Hypertension   . Neck injury     Past Surgical History:  Procedure Laterality Date  . POSTERIOR CERVICAL FUSION/FORAMINOTOMY  06/01/2011   Procedure: POSTERIOR CERVICAL FUSION/FORAMINOTOMY LEVEL 1;  Surgeon: Winfield Cunas, MD;  Location: [place] [facility];  Service: Neurosurgery;  Laterality: N/A;  Posterior Cervical Fusion Cervical Six-Seven  . UTERINE FIBROID SURGERY     for heavy menstrual bleeding    Family History  Problem Relation Age of Onset  . Breast cancer Sister 66    died in 07-26-08   . Hypertension Mother     Social History   Social History  . Marital status:  Married    Spouse name: N/A  . Number of children: N/A  . Years of education: N/A   Social History Main Topics  . Smoking status: Never Smoker  . Smokeless tobacco: None  . Alcohol use No  . Drug use: No  . Sexual activity: Not Asked   Other Topics Concern  . None   Social History Narrative   Work or [place] Situation: lives with husband      Spiritual Beliefs: Christian      Lifestyle: no regular exercise; diet is ok              Current Outpatient Prescriptions:  .  amLODipine (NORVASC) 5 MG tablet, Take 1 tablet (5 mg total) by mouth daily., Disp: 90 tablet, Rfl: 0  EXAM:  Vitals:   02/08/16 1000  BP: 140/90  Pulse: 86  Temp: 98.6 F (37 C)   Filed Weights   02/08/16 1000  Weight: 243 lb 12.8 oz (110.6 kg)   GENERAL: vitals reviewed and listed below, alert, oriented, appears well hydrated and in no acute distress  HEENT: head atraumatic, PERRLA, normal appearance of eyes, ears, nose and mouth. moist mucus membranes.  NECK: supple, no masses or lymphadenopathy  LUNGS: clear to auscultation bilaterally, no rales, rhonchi or wheeze  CV: HRRR, no peripheral edema or cyanosis, normal pedal pulses  BREAST:declined, saw breast center today  ABDOMEN: bowel sounds normal, soft, non tender to palpation, no masses, no rebound or guarding  GU: declined  SKIN: no rash  or abnormal lesions  MS: normal gait, moves all extremities normally  NEURO: normal gait, speech and thought processing grossly intact, muscle tone grossly intact throughout  PSYCH: normal affect, pleasant and cooperative  ASSESSMENT AND PLAN:  Discussed the following assessment and plan:  Encounter for preventive health examination - Plan: Lipid Panel, Hemoglobin A1c  Essential hypertension - Plan: Basic metabolic panel, CBC (no diff)  Class 2 obesity due to excess calories without serious comorbidity with body mass index (BMI) of 35.0 to 35.9 in  adult  -lengthy discussion about lifestyle as she refused to ad medication for bp today - close f/u in 1 month  -Discussed and advised all [place] preventive services health task force level A and B recommendations for age, sex and risks.  -Advised at least 150 minutes of exercise per week and a healthy diet     -labs, studies and vaccines per orders this encounter  Orders Placed This Encounter  Procedures  . Lipid Panel  . Basic metabolic panel  . CBC (no diff)  . Hemoglobin A1c    Patient advised to return to clinic immediately if symptoms worsen or persist or new concerns.  Patient Instructions  BEFORE YOU LEAVE: -follow up: 1 month for hypertension  We have ordered labs or studies at this visit. It can take up to 1-2 weeks for results and processing. IF results require follow up or explanation, we will call you with instructions. Clinically stable results will be released to your [facility]. If you have not heard from [place] or cannot find your results in [facility] in 2 weeks please contact our office at [phone].   We recommend the following healthy lifestyle for LIFE: 1) Small portions.   Tip: eat off of a salad plate instead of a dinner plate.  Tip: It is ok to feel hungry after a meal - that likely means you ate an appropriate portion.  Tip: if you need more or a snack choose fruits, veggies and/or a handful of nuts or seeds.  2) Eat a healthy clean diet.  * Tip: Avoid (less then 1 serving per week): processed foods, sweets, sweetened drinks, white starches (rice, flour, bread, potatoes, pasta, etc), red meat, fast foods, butter  *Tip: CHOOSE instead   * 5-9 servings per day of fresh or frozen fruits and vegetables (but not corn, potatoes, bananas, canned or dried fruit)   *nuts and seeds, beans   *olives and olive oil   *small portions of lean meats such as fish and white chicken    *small portions of whole grains  3)Get at least 150 minutes of sweaty aerobic exercise per  week.  4)Reduce stress - consider counseling, meditation and relaxation to balance other aspects of your life.   If you are not yet signed up for [facility], please SIGN UP TODAY. We now offer online scheduling, same day appointments and extended hours. WHEN YOU DON'T FEEL YOUR BEST.Marland KitchenMarland KitchenWE ARE HERE TO HELP.             No Follow-up on file.  Colin Benton R., DO

## 2016-02-08 NOTE — Progress Notes (Signed)
Pre visit review using our clinic review tool, if applicable. No additional management support is needed unless otherwise documented below in the visit note. 

## 2016-02-11 NOTE — Addendum Note (Signed)
Addended by: Agnes Lawrence on: 02/11/2016 10:32 AM   Modules accepted: Orders

## 2016-02-15 ENCOUNTER — Other Ambulatory Visit (INDEPENDENT_AMBULATORY_CARE_PROVIDER_SITE_OTHER): Payer: 59

## 2016-02-15 DIAGNOSIS — I10 Essential (primary) hypertension: Secondary | ICD-10-CM | POA: Diagnosis not present

## 2016-02-15 DIAGNOSIS — R899 Unspecified abnormal finding in specimens from other organs, systems and tissues: Secondary | ICD-10-CM

## 2016-02-15 LAB — CBC WITH DIFFERENTIAL/PLATELET
BASOS PCT: 0 %
Basophils Absolute: 0 cells/uL (ref 0–200)
EOS PCT: 3 %
Eosinophils Absolute: 141 cells/uL (ref 15–500)
HCT: 32.1 % — ABNORMAL LOW (ref 35.0–45.0)
Hemoglobin: 9.6 g/dL — ABNORMAL LOW (ref 11.7–15.5)
LYMPHS PCT: 48 %
Lymphs Abs: 2256 cells/uL (ref 850–3900)
MCH: 21.1 pg — ABNORMAL LOW (ref 27.0–33.0)
MCHC: 29.9 g/dL — ABNORMAL LOW (ref 32.0–36.0)
MCV: 70.4 fL — ABNORMAL LOW (ref 80.0–100.0)
MONOS PCT: 9 %
MPV: 9.6 fL (ref 7.5–12.5)
Monocytes Absolute: 423 cells/uL (ref 200–950)
NEUTROS ABS: 1880 {cells}/uL (ref 1500–7800)
Neutrophils Relative %: 40 %
PLATELETS: 425 10*3/uL — AB (ref 140–400)
RBC: 4.56 MIL/uL (ref 3.80–5.10)
RDW: 18.5 % — AB (ref 11.0–15.0)
WBC: 4.7 10*3/uL (ref 3.8–10.8)

## 2016-02-15 NOTE — Addendum Note (Signed)
Addended by: Gari Crown D on: 02/15/2016 03:49 PM   Modules accepted: Orders

## 2016-02-16 LAB — VITAMIN B12: VITAMIN B 12: 477 pg/mL (ref 200–1100)

## 2016-02-16 LAB — FERRITIN: FERRITIN: 5 ng/mL — AB (ref 10–232)

## 2016-02-18 ENCOUNTER — Encounter: Payer: Self-pay | Admitting: Family Medicine

## 2016-02-18 ENCOUNTER — Ambulatory Visit (INDEPENDENT_AMBULATORY_CARE_PROVIDER_SITE_OTHER): Payer: 59 | Admitting: Family Medicine

## 2016-02-18 VITALS — BP 136/94 | HR 81 | Temp 98.1°F | Ht 68.75 in | Wt 241.8 lb

## 2016-02-18 DIAGNOSIS — D259 Leiomyoma of uterus, unspecified: Secondary | ICD-10-CM

## 2016-02-18 DIAGNOSIS — I10 Essential (primary) hypertension: Secondary | ICD-10-CM | POA: Diagnosis not present

## 2016-02-18 DIAGNOSIS — D509 Iron deficiency anemia, unspecified: Secondary | ICD-10-CM | POA: Diagnosis not present

## 2016-02-18 NOTE — Progress Notes (Signed)
HPI:  Acute visit for Anemia: - Iron deficiency anemia found on recent labs and confirmed with repeat testing - Only symptom is fatigue - Patient reports history of iron deficiency anemia secondary to heavy menstrual bleeding, fibroids, partial fibroidectomy and resolution of her anemia about 5 years ago - Still having menstrual periods monthly, moderately heavy for the first 2 days, cycles last about 4-5 days - Denies intramenstrual spotting, changes in bowels, melena, hematochezia, fevers, malaise, vegetarian diet, weight loss or any other symptoms  ROS: See pertinent positives and negatives per HPI.  Past Medical History:  Diagnosis Date  . Fracture of spine, cervical, without spinal cord injury, closed ([place]) 05/31/2011  . Hypertension   . Neck injury     Past Surgical History:  Procedure Laterality Date  . POSTERIOR CERVICAL FUSION/FORAMINOTOMY  06/01/2011   Procedure: POSTERIOR CERVICAL FUSION/FORAMINOTOMY LEVEL 1;  Surgeon: Winfield Cunas, MD;  Location: [place] [facility];  Service: Neurosurgery;  Laterality: N/A;  Posterior Cervical Fusion Cervical Six-Seven  . UTERINE FIBROID SURGERY     for heavy menstrual bleeding    Family History  Problem Relation Age of Onset  . Breast cancer Sister 16    died in 07/21/2008   . Hypertension Mother     Social History   Social History  . Marital status: Married    Spouse name: N/A  . Number of children: N/A  . Years of education: N/A   Social History Main Topics  . Smoking status: Never Smoker  . Smokeless tobacco: None  . Alcohol use No  . Drug use: No  . Sexual activity: Not Asked   Other Topics Concern  . None   Social History Narrative   Work or [place] Situation: lives with husband      Spiritual Beliefs: Christian      Lifestyle: no regular exercise; diet is ok              Current Outpatient Prescriptions:  .  amLODipine (NORVASC) 5 MG tablet, Take 1 tablet (5 mg total) by mouth  daily., Disp: 90 tablet, Rfl: 0  EXAM:  Vitals:   02/18/16 1533  BP: (!) 136/94  Pulse: 81  Temp: 98.1 F (36.7 C)    Body mass index is 35.97 kg/m.  GENERAL: vitals reviewed and listed above, alert, oriented, appears well hydrated and in no acute distress  HEENT: atraumatic, conjunttiva clear, no obvious abnormalities on inspection of external nose and ears  NECK: no obvious masses on inspection  LUNGS: clear to auscultation bilaterally, no wheezes, rales or rhonchi, good air movement  CV: HRRR, no peripheral edema  MS: moves all extremities without noticeable abnormality  PSYCH: pleasant and cooperative, no obvious depression or anxiety  ASSESSMENT AND PLAN:  Discussed the following assessment and plan:  Iron deficiency anemia, unspecified iron deficiency anemia type - Plan: Ambulatory referral to Gastroenterology -discussed and reviewed labs with patient -we discussed possible serious and likely etiologies, workup and treatment, treatment risks and return precautions - while this could be secondary to her menstrual bleeding, her menstrual bleeding is not excessively heavy point -after this discussion, Betzayda opted for evaluation with gastroenterologist to GI losses of blood - Referral placed, will re-evaluation with gynecologist to assess status of her fibroids, daily iron supplement for now -follow up advised after these evaluations, may need hematology evaluation if unable to determine cause of persistent anemia -of course, we advised [place]  to return or notify  a doctor immediately if symptoms worsen or persist or new concerns arise.  Uterine leiomyoma, unspecified location -patient agreeable to scheduling GYN appointment on her own  Essential hypertension -advised increased or change in blood pressure medication - she refused prefers to work on lifestyle -She agrees to follow up in 1 month  to recheck -Patient advised to return or notify a doctor immediately if  symptoms worsen or persist or new concerns arise.  Patient Instructions  BEFORE YOU LEAVE: -cancel any current appointments and set up follow-up appointment in January  -We placed a referral for you as discussed to the gastroenterologist. It usually takes about 1-2 weeks to process and schedule this referral. If you have not heard from [place] regarding this appointment in 2 weeks please contact our office.  -These take an iron supplement daily  -These call to set up an appointment with a gynecologist for evaluation of your bleeding, history of fibroids and anemia       Kiaja Shorty, Jarrett Soho R., DO

## 2016-02-18 NOTE — Progress Notes (Signed)
Pre visit review using our clinic review tool, if applicable. No additional management support is needed unless otherwise documented below in the visit note. 

## 2016-02-18 NOTE — Patient Instructions (Signed)
BEFORE YOU LEAVE: -cancel any current appointments and set up follow-up appointment in January  -We placed a referral for you as discussed to the gastroenterologist. It usually takes about 1-2 weeks to process and schedule this referral. If you have not heard from [place] regarding this appointment in 2 weeks please contact our office.  -These take an iron supplement daily  -These call to set up an appointment with a gynecologist for evaluation of your bleeding, history of fibroids and anemia

## 2016-02-25 ENCOUNTER — Other Ambulatory Visit: Payer: Self-pay | Admitting: Family Medicine

## 2016-02-28 ENCOUNTER — Encounter: Payer: 59 | Admitting: Family Medicine

## 2016-03-11 ENCOUNTER — Ambulatory Visit: Payer: 59 | Admitting: Family Medicine

## 2016-03-19 ENCOUNTER — Encounter: Payer: Self-pay | Admitting: Gastroenterology

## 2016-04-25 ENCOUNTER — Ambulatory Visit: Payer: 59 | Admitting: Family Medicine

## 2016-05-06 ENCOUNTER — Encounter: Payer: Self-pay | Admitting: Gastroenterology

## 2016-05-06 ENCOUNTER — Other Ambulatory Visit (INDEPENDENT_AMBULATORY_CARE_PROVIDER_SITE_OTHER): Payer: 59

## 2016-05-06 ENCOUNTER — Ambulatory Visit (INDEPENDENT_AMBULATORY_CARE_PROVIDER_SITE_OTHER): Payer: 59 | Admitting: Gastroenterology

## 2016-05-06 VITALS — BP 132/86 | HR 76 | Ht 68.0 in | Wt 246.5 lb

## 2016-05-06 DIAGNOSIS — D509 Iron deficiency anemia, unspecified: Secondary | ICD-10-CM

## 2016-05-06 LAB — IBC PANEL
Iron: 165 ug/dL — ABNORMAL HIGH (ref 42–145)
SATURATION RATIOS: 40 % (ref 20.0–50.0)
Transferrin: 295 mg/dL (ref 212.0–360.0)

## 2016-05-06 LAB — FOLATE: FOLATE: 12 ng/mL (ref >5.9)

## 2016-05-06 LAB — VITAMIN B12: Vitamin B-12: 480 pg/mL (ref 211–911)

## 2016-05-06 LAB — FERRITIN: FERRITIN: 10.7 ng/mL (ref 10.0–291.0)

## 2016-05-06 MED ORDER — NA SULFATE-K SULFATE-MG SULF 17.5-3.13-1.6 GM/177ML PO SOLN: 1.0000 | Freq: Once | ORAL | 0 refills | Status: AC

## 2016-05-06 NOTE — Progress Notes (Signed)
Olivia West    PX:[id number]    [birth year]  Primary Care Physician:KIM, Nickola Major., DO  Referring Physician: Lucretia Kern, DO [place], [place] [id number]  Chief complaint:  Iron deficiency anemia  HPI: 51 year old female here for new patient evaluation with iron deficiency anemia. Patient said she was anemic around 2007/07/05 and at that time it was thought secondary to her heavy menstrual cycles, she was diagnosed with uterine fibroids, underwent ablation with control of heavy bleeding during menstrual cycles. She is currently perimenopausal with irregular menses and usually are not heavy. On recent labs done by her primary care physician she was noted to be anemic with hemoglobin 9.6, hematocrit 32, ferritin 5 and B12 477. She denies any specific GI symptoms. No dysphagia, odynophagia, nausea, vomiting, abdominal pain, constipation, diarrhea, melena or blood per rectum. She does not drink excessive alcohol and denies any herbal remedies or recreational drugs. She rarely takes any nsaids. No family history of colon or gastric cancer.  Outpatient Encounter Prescriptions as of 05/06/2016  Medication Sig  . amLODipine (NORVASC) 5 MG tablet TAKE 1 TABLET BY MOUTH DAILY  . Ferrous Sulfate (FEROSUL PO) Take 1 tsp by mouth every morning    No facility-administered encounter medications on file as of 05/06/2016.     Allergies as of 05/06/2016 - Review Complete 05/06/2016  Allergen Reaction Noted  . Hydrochlorothiazide Shortness Of Breath and Palpitations 05/02/2014    Past Medical History:  Diagnosis Date  . Anemia   . Fracture of spine, cervical, without spinal cord injury, closed ([place]) 05/31/2011  . Hypertension   . Neck injury     Past Surgical History:  Procedure Laterality Date  . POSTERIOR CERVICAL FUSION/FORAMINOTOMY  06/01/2011   Procedure: POSTERIOR CERVICAL FUSION/FORAMINOTOMY LEVEL 1;  Surgeon: Winfield Cunas, MD;  Location: [place] [facility];  Service:  Neurosurgery;  Laterality: N/A;  Posterior Cervical Fusion Cervical Six-Seven  . UTERINE FIBROID SURGERY     for heavy menstrual bleeding    Family History  Problem Relation Age of Onset  . Breast cancer Sister 51    died in July 04, 2008   . Hypertension Mother     all of family    Social History   Social History  . Marital status: Married    Spouse name: N/A  . Number of children: 0  . Years of education: N/A   Occupational History  . customer service    Social History Main Topics  . Smoking status: Never Smoker  . Smokeless tobacco: Never Used  . Alcohol use No  . Drug use: No  . Sexual activity: Not on file   Other Topics Concern  . Not on file   Social History Narrative   Work or Biomedical engineer      Home Situation: lives with husband      Spiritual Beliefs: Christian      Lifestyle: no regular exercise; diet is ok               Review of systems: Review of Systems  Constitutional: Negative for fever and chills.  HENT: Negative.   Eyes: Negative for blurred vision.  Respiratory: Negative for cough, shortness of breath and wheezing.  Prior TB Cardiovascular: Negative for chest pain and palpitations.  Gastrointestinal: as per HPI Genitourinary: Negative for dysuria, urgency, frequency and hematuria.  Musculoskeletal: Negative for myalgias, back pain and joint pain.  Skin: Negative for itching and rash.  Neurological: Negative for dizziness, tremors, focal weakness, seizures and loss of consciousness.  Endo/Heme/Allergies: Positive for seasonal allergies.  Psychiatric/Behavioral: Negative for depression, suicidal ideas and hallucinations.  All other systems reviewed and are negative.   Physical Exam: Vitals:   05/06/16 1516  BP: 132/86  Pulse: 76   Body mass index is 37.48 kg/m. Gen:      No acute distress HEENT:  EOMI, sclera anicteric Neck:     No masses; no thyromegaly Lungs:    Clear to auscultation bilaterally; normal  respiratory effort CV:         Regular rate and rhythm; no murmurs Abd:      + bowel sounds; soft, non-tender; no palpable masses, no distension Ext:    No edema; adequate peripheral perfusion Skin:      Warm and dry; no rash Neuro: alert and oriented x 3 Psych: normal mood and affect  Data Reviewed:  Reviewed labs, radiology imaging, old records and pertinent past GI work up   Assessment and Plan/Recommendations: 51 year old female here for evaluation of iron deficiency anemia We will schedule for EGD and colonoscopy for evaluation of possible etiology for GI blood loss; will need to exclude H. pylori gastritis, peptic ulcer disease, angiectasia, malignancy The risks and benefits as well as alternatives of endoscopic procedure(s) have been discussed and reviewed. All questions answered. The patient agrees to proceed. Recheck ferritin and iron studies to see if has any improvement in the past 3 months with iron supplements  Greater than 50% of the time used for counseling as well as treatment plan and follow-up. She had multiple questions which were answered to her satisfaction  K. Denzil Magnuson , MD [phone] Mon-Fri 8a-5p [phone] after 5p, weekends, holidays  CC: Lucretia Kern, DO

## 2016-05-06 NOTE — Patient Instructions (Signed)
You have been scheduled for an endoscopy and colonoscopy. Please follow the written instructions given to you at your visit today. Please pick up your prep supplies at the pharmacy within the next 1-3 days. If you use inhalers (even only as needed), please bring them with you on the day of your procedure. Your physician has requested that you go to www.startemmi.com and enter the access code given to you at your visit today. This web site gives a general overview about your procedure. However, you should still follow specific instructions given to you by our office regarding your preparation for the procedure.  Your physician has requested that you go to the basement for lab work before leaving today. 

## 2016-05-15 ENCOUNTER — Encounter: Payer: Self-pay | Admitting: Gastroenterology

## 2016-05-15 NOTE — Progress Notes (Deleted)
  HPI:  Seen recently for anemia and had elevated BP. Advised change in antihypertensive regimen - she refused and wanted to work on lifestyle changes. Reports. Denies. She saw Dr. Silverio Decamp about the anemia. EGD And colonoscopy planned. She was also to see her gynecologist.  ROS: See pertinent positives and negatives per HPI.  Past Medical History:  Diagnosis Date  . Anemia   . Fracture of spine, cervical, without spinal cord injury, closed ([place]) 05/31/2011  . Hypertension   . Neck injury     Past Surgical History:  Procedure Laterality Date  . POSTERIOR CERVICAL FUSION/FORAMINOTOMY  06/01/2011   Procedure: POSTERIOR CERVICAL FUSION/FORAMINOTOMY LEVEL 1;  Surgeon: Winfield Cunas, MD;  Location: [place] [facility];  Service: Neurosurgery;  Laterality: N/A;  Posterior Cervical Fusion Cervical Six-Seven  . UTERINE FIBROID SURGERY     for heavy menstrual bleeding    Family History  Problem Relation Age of Onset  . Breast cancer Sister 63    died in 07/20/2008   . Hypertension Mother     all of family    Social History   Social History  . Marital status: Married    Spouse name: N/A  . Number of children: 0  . Years of education: N/A   Occupational History  . customer service    Social History Main Topics  . Smoking status: Never Smoker  . Smokeless tobacco: Never Used  . Alcohol use No  . Drug use: No  . Sexual activity: Not on file   Other Topics Concern  . Not on file   Social History Narrative   Work or Biomedical engineer      Home Situation: lives with husband      Spiritual Beliefs: Christian      Lifestyle: no regular exercise; diet is ok              Current Outpatient Prescriptions:  .  amLODipine (NORVASC) 5 MG tablet, TAKE 1 TABLET BY MOUTH DAILY, Disp: 90 tablet, Rfl: 0 .  Ferrous Sulfate (FEROSUL PO), Take 1 tsp by mouth every morning , Disp: , Rfl:   EXAM:  There were no vitals filed for this visit.  There is no height or weight on file to  calculate BMI.  GENERAL: vitals reviewed and listed above, alert, oriented, appears well hydrated and in no acute distress  HEENT: atraumatic, conjunttiva clear, no obvious abnormalities on inspection of external nose and ears  NECK: no obvious masses on inspection  LUNGS: clear to auscultation bilaterally, no wheezes, rales or rhonchi, good air movement  CV: HRRR, no peripheral edema  MS: moves all extremities without noticeable abnormality  PSYCH: pleasant and cooperative, no obvious depression or anxiety  ASSESSMENT AND PLAN:  Discussed the following assessment and plan:  No diagnosis found.  -Patient advised to return or notify a doctor immediately if symptoms worsen or persist or new concerns arise.  There are no Patient Instructions on file for this visit.  Colin Benton R., DO

## 2016-05-16 ENCOUNTER — Telehealth: Payer: Self-pay | Admitting: *Deleted

## 2016-05-16 ENCOUNTER — Ambulatory Visit: Payer: 59 | Admitting: Family Medicine

## 2016-05-16 NOTE — Telephone Encounter (Signed)
Patient was a no-show for today's appt.  I called the pt to check on her and she stated she thought her appt was at 3:30pm today.  Patient rescheduled.

## 2016-05-20 ENCOUNTER — Ambulatory Visit: Payer: 59 | Admitting: Family Medicine

## 2016-05-20 DIAGNOSIS — Z0289 Encounter for other administrative examinations: Secondary | ICD-10-CM

## 2016-05-20 NOTE — Progress Notes (Deleted)
  HPI:  Olivia West is a pleasant 51 y.o. here for follow up. Chronic medical problems summarized below were reviewed for changes and stability and were updated as needed below. These issues and their treatment remain stable for the most part. ***. Denies CP, SOB, DOE, treatment intolerance or new symptoms. Labs next visit.  HTN: -meds: norvasc  -some chronic mild ankle edema not worsened by CCB  Obesity: -243 (10/2015) --> 241 --> -Diet: reports diet poor -Exercise: no regular exercise  Iron Def Anemia: -referred to GI in 2015/06/30 and advised to see gyn for the moderately heavy menstrual periods - is to have EGD and colonoscopy  -taking iron   ROS: See pertinent positives and negatives per HPI.  Past Medical History:  Diagnosis Date  . Anemia   . Fracture of spine, cervical, without spinal cord injury, closed ([place]) 05/31/2011  . Hypertension   . Neck injury     Past Surgical History:  Procedure Laterality Date  . POSTERIOR CERVICAL FUSION/FORAMINOTOMY  06/01/2011   Procedure: POSTERIOR CERVICAL FUSION/FORAMINOTOMY LEVEL 1;  Surgeon: Winfield Cunas, MD;  Location: [place] [facility];  Service: Neurosurgery;  Laterality: N/A;  Posterior Cervical Fusion Cervical Six-Seven  . UTERINE FIBROID SURGERY     for heavy menstrual bleeding    Family History  Problem Relation Age of Onset  . Breast cancer Sister 88    died in 29-Jun-2008   . Hypertension Mother     all of family    Social History   Social History  . Marital status: Married    Spouse name: N/A  . Number of children: 0  . Years of education: N/A   Occupational History  . customer service    Social History Main Topics  . Smoking status: Never Smoker  . Smokeless tobacco: Never Used  . Alcohol use No  . Drug use: No  . Sexual activity: Not on file   Other Topics Concern  . Not on file   Social History Narrative   Work or Biomedical engineer      Home Situation: lives with husband      Spiritual Beliefs:  Christian      Lifestyle: no regular exercise; diet is ok              Current Outpatient Prescriptions:  .  amLODipine (NORVASC) 5 MG tablet, TAKE 1 TABLET BY MOUTH DAILY, Disp: 90 tablet, Rfl: 0 .  Ferrous Sulfate (FEROSUL PO), Take 1 tsp by mouth every morning , Disp: , Rfl:   EXAM:  There were no vitals filed for this visit.  There is no height or weight on file to calculate BMI.  GENERAL: vitals reviewed and listed above, alert, oriented, appears well hydrated and in no acute distress  HEENT: atraumatic, conjunttiva clear, no obvious abnormalities on inspection of external nose and ears  NECK: no obvious masses on inspection  LUNGS: clear to auscultation bilaterally, no wheezes, rales or rhonchi, good air movement  CV: HRRR, no peripheral edema  MS: moves all extremities without noticeable abnormality  PSYCH: pleasant and cooperative, no obvious depression or anxiety  ASSESSMENT AND PLAN:  Discussed the following assessment and plan:  No diagnosis found.  -Patient advised to return or notify a doctor immediately if symptoms worsen or persist or new concerns arise.  There are no Patient Instructions on file for this visit.  Colin Benton R., DO

## 2016-05-28 ENCOUNTER — Ambulatory Visit (AMBULATORY_SURGERY_CENTER): Payer: 59 | Admitting: Gastroenterology

## 2016-05-28 ENCOUNTER — Encounter: Payer: Self-pay | Admitting: Gastroenterology

## 2016-05-28 VITALS — BP 155/87 | HR 71 | Temp 97.3°F | Resp 15 | Ht 68.0 in | Wt 246.0 lb

## 2016-05-28 DIAGNOSIS — D508 Other iron deficiency anemias: Secondary | ICD-10-CM

## 2016-05-28 DIAGNOSIS — D509 Iron deficiency anemia, unspecified: Secondary | ICD-10-CM | POA: Diagnosis not present

## 2016-05-28 MED ORDER — SODIUM CHLORIDE 0.9 % IV SOLN: 500.0000 mL | INTRAVENOUS | Status: DC

## 2016-05-28 NOTE — Op Note (Signed)
[facility] Patient Name: Olivia West Procedure Date: 05/28/2016 2:58 PM MRN: [id number] Endoscopist: Mauri Pole , MD Age: 51 Referring MD:  Date of Birth: 05/10/1965 Gender: Female Account #: [id number] Procedure:                Colonoscopy Indications:              Unexplained iron deficiency anemia Medicines:                Monitored Anesthesia Care Procedure:                Pre-Anesthesia Assessment:                           - Prior to the procedure, a History and Physical                            was performed, and patient medications and                            allergies were reviewed. The patient's tolerance of                            previous anesthesia was also reviewed. The risks                            and benefits of the procedure and the sedation                            options and risks were discussed with the patient.                            All questions were answered, and informed consent                            was obtained. Prior Anticoagulants: The patient has                            taken no previous anticoagulant or antiplatelet                            agents. ASA Grade Assessment: II - A patient with                            mild systemic disease. After reviewing the risks                            and benefits, the patient was deemed in                            satisfactory condition to undergo the procedure.                           After obtaining informed consent, the colonoscope  was passed under direct vision. Throughout the                            procedure, the patient's blood pressure, pulse, and                            oxygen saturations were monitored continuously. The                            Colonoscope was introduced through the anus and                            advanced to the the terminal ileum, with                            identification of the  appendiceal orifice and IC                            valve. The colonoscopy was performed without                            difficulty. The patient tolerated the procedure                            well. The quality of the bowel preparation was                            adequate. The ileocecal valve, appendiceal orifice,                            and rectum were photographed. Scope In: 3:10:42 PM Scope Out: 3:28:06 PM Scope Withdrawal Time: 0 hours 14 minutes 39 seconds  Total Procedure Duration: 0 hours 17 minutes 24 seconds  Findings:                 The perianal and digital rectal examinations were                            normal.                           The entire examined colon appeared normal. Complications:            No immediate complications. Estimated Blood Loss:     Estimated blood loss: none. Impression:               - The entire examined colon is normal.                           - No specimens collected. Recommendation:           - Patient has a contact number available for                            emergencies. The signs and symptoms of potential  delayed complications were discussed with the                            patient. Return to normal activities tomorrow.                            Written discharge instructions were provided to the                            patient.                           - Resume previous diet.                           - Continue present medications.                           - Repeat colonoscopy in 10 years for screening                            purposes.                           - Return to GI clinic PRN.                           -Continue oral Iron replacement (ferrous sulphate                            325mg  TID with meals)                           -Recheck CBC and ferritin in 3 months                           -If continues to have significant iron deficiency                             anemia and fecal heme occult positive, will need to                            consider small bowel video capsule for further                            evaluation Mauri Pole, MD 05/28/2016 3:42:10 PM This report has been signed electronically.

## 2016-05-28 NOTE — Op Note (Signed)
[facility] Patient Name: Olivia West Procedure Date: 05/28/2016 2:58 PM MRN: [id number] Endoscopist: Mauri Pole , MD Age: 51 Referring MD:  Date of Birth: [birth year] Gender: Female Account #: [id number] Procedure:                Upper GI endoscopy Indications:              Suspected upper gastrointestinal bleeding in                            patient with unexplained iron deficiency anemia Medicines:                Monitored Anesthesia Care Procedure:                Pre-Anesthesia Assessment:                           - Prior to the procedure, a History and Physical                            was performed, and patient medications and                            allergies were reviewed. The patient's tolerance of                            previous anesthesia was also reviewed. The risks                            and benefits of the procedure and the sedation                            options and risks were discussed with the patient.                            All questions were answered, and informed consent                            was obtained. Prior Anticoagulants: The patient has                            taken no previous anticoagulant or antiplatelet                            agents. ASA Grade Assessment: II - A patient with                            mild systemic disease. After reviewing the risks                            and benefits, the patient was deemed in                            satisfactory condition to undergo the procedure.  After obtaining informed consent, the endoscope was                            passed under direct vision. Throughout the                            procedure, the patient's blood pressure, pulse, and                            oxygen saturations were monitored continuously. The                            Endoscope was introduced through the mouth, and                            advanced  to the second part of duodenum. The upper                            GI endoscopy was accomplished without difficulty.                            The patient tolerated the procedure well. Scope In: Scope Out: Findings:                 The esophagus was normal.                           The stomach was normal.                           The examined duodenum was normal. Complications:            No immediate complications. Estimated Blood Loss:     Estimated blood loss: none. Impression:               - Normal esophagus.                           - Normal stomach.                           - Normal examined duodenum.                           - No specimens collected. Recommendation:           - Patient has a contact number available for                            emergencies. The signs and symptoms of potential                            delayed complications were discussed with the                            patient. Return to normal activities tomorrow.  Written discharge instructions were provided to the                            patient.                           - Resume previous diet.                           - Continue present medications.                           - No repeat upper endoscopy.                           - Return to GI office PRN. Mauri Pole, MD 05/28/2016 3:33:10 PM This report has been signed electronically.

## 2016-05-28 NOTE — Patient Instructions (Signed)
YOU HAD AN ENDOSCOPIC PROCEDURE TODAY AT [facility]:   Refer to the procedure report that was given to you for any specific questions about what was found during the examination.  If the procedure report does not answer your questions, please call your gastroenterologist to clarify.  If you requested that your care partner not be given the details of your procedure findings, then the procedure report has been included in a sealed envelope for you to review at your convenience later.  YOU SHOULD EXPECT: Some feelings of bloating in the abdomen. Passage of more gas than usual.  Walking can help get rid of the air that was put into your GI tract during the procedure and reduce the bloating. If you had a lower endoscopy (such as a colonoscopy or flexible sigmoidoscopy) you may notice spotting of blood in your stool or on the toilet paper. If you underwent a bowel prep for your procedure, you may not have a normal bowel movement for a few days.  Please Note:  You might notice some irritation and congestion in your nose or some drainage.  This is from the oxygen used during your procedure.  There is no need for concern and it should clear up in a day or so.  SYMPTOMS TO REPORT IMMEDIATELY:   Following lower endoscopy (colonoscopy or flexible sigmoidoscopy):  Excessive amounts of blood in the stool  Significant tenderness or worsening of abdominal pains  Swelling of the abdomen that is new, acute  Fever of 100F or higher   Following upper endoscopy (EGD)  Vomiting of blood or coffee ground material  New chest pain or pain under the shoulder blades  Painful or persistently difficult swallowing  New shortness of breath  Fever of 100F or higher  Black, tarry-looking stools  For urgent or emergent issues, a gastroenterologist can be reached at any hour by calling [phone].   DIET:  We do recommend a small meal at first, but then you may proceed to your regular diet.  Drink  plenty of fluids but you should avoid alcoholic beverages for 24 hours.  ACTIVITY:  You should plan to take it easy for the rest of today and you should NOT DRIVE or use heavy machinery until tomorrow (because of the sedation medicines used during the test).    FOLLOW UP: Our staff will call the number listed on your records the next business day following your procedure to check on you and address any questions or concerns that you may have regarding the information given to you following your procedure. If we do not reach you, we will leave a message.  However, if you are feeling well and you are not experiencing any problems, there is no need to return our call.  We will assume that you have returned to your regular daily activities without incident.  If any biopsies were taken you will be contacted by phone or by letter within the next 1-3 weeks.  Please call us at [phone] if you have not heard about the biopsies in 3 weeks.    SIGNATURES/CONFIDENTIALITY: You and/or your care partner have signed paperwork which will be entered into your electronic medical record.  These signatures attest to the fact that that the information above on your After Visit Summary has been reviewed and is understood.  Full responsibility of the confidentiality of this discharge information lies with you and/or your care-partner.  Normal upper and lower endoscopy.  Repeat colonscopy in 10 years-2028.  Recheck CBC and ferritin in 3 months.  Continue oral Iron replacement three times daily with meals.

## 2016-05-28 NOTE — Progress Notes (Signed)
Report to PACU, RN, vss, BBS= Clear.  

## 2016-05-29 ENCOUNTER — Other Ambulatory Visit: Payer: Self-pay

## 2016-05-29 ENCOUNTER — Telehealth: Payer: Self-pay | Admitting: *Deleted

## 2016-05-29 DIAGNOSIS — D500 Iron deficiency anemia secondary to blood loss (chronic): Secondary | ICD-10-CM

## 2016-05-29 NOTE — Telephone Encounter (Signed)
  Follow up Call-  Call back number 05/28/2016  Post procedure Call Back phone  # [phone]  Permission to leave phone message Yes  Some recent data might be hidden     Patient questions:  Do you have a fever, pain , or abdominal swelling? No. Pain Score  0 *  Have you tolerated food without any problems? Yes.    Have you been able to return to your normal activities? Yes.    Do you have any questions about your discharge instructions: Diet   No. Medications  No. Follow up visit  No.  Do you have questions or concerns about your Care? No.  Actions: * If pain score is 4 or above: No action needed, pain <4.

## 2016-05-29 NOTE — Telephone Encounter (Signed)
  Follow up Call-  Call back number 05/28/2016  Post procedure Call Back phone  # [phone]  Permission to leave phone message Yes  Some recent data might be hidden     Patient questions:  Left message to call us if necessary.

## 2016-06-06 ENCOUNTER — Encounter: Payer: Self-pay | Admitting: Family Medicine

## 2016-06-06 ENCOUNTER — Ambulatory Visit (INDEPENDENT_AMBULATORY_CARE_PROVIDER_SITE_OTHER): Payer: 59 | Admitting: Family Medicine

## 2016-06-06 VITALS — BP 122/90 | HR 92 | Temp 98.4°F | Ht 68.0 in | Wt 252.3 lb

## 2016-06-06 DIAGNOSIS — IMO0001 Reserved for inherently not codable concepts without codable children: Secondary | ICD-10-CM

## 2016-06-06 DIAGNOSIS — I10 Essential (primary) hypertension: Secondary | ICD-10-CM | POA: Diagnosis not present

## 2016-06-06 DIAGNOSIS — D259 Leiomyoma of uterus, unspecified: Secondary | ICD-10-CM

## 2016-06-06 DIAGNOSIS — Z6838 Body mass index (BMI) 38.0-38.9, adult: Secondary | ICD-10-CM | POA: Diagnosis not present

## 2016-06-06 DIAGNOSIS — N920 Excessive and frequent menstruation with regular cycle: Secondary | ICD-10-CM | POA: Diagnosis not present

## 2016-06-06 DIAGNOSIS — E6609 Other obesity due to excess calories: Secondary | ICD-10-CM

## 2016-06-06 DIAGNOSIS — D500 Iron deficiency anemia secondary to blood loss (chronic): Secondary | ICD-10-CM

## 2016-06-06 HISTORY — DX: Leiomyoma of uterus, unspecified: D25.9

## 2016-06-06 HISTORY — DX: Iron deficiency anemia secondary to blood loss (chronic): D50.0

## 2016-06-06 MED ORDER — AMLODIPINE BESYLATE 5 MG PO TABS: 7.5000 mg | ORAL_TABLET | Freq: Every day | ORAL | 1 refills | Status: DC

## 2016-06-06 NOTE — Progress Notes (Signed)
HPI:  Follow up HTN, Anemia, Obesity with BMI > 35 and comorbidity.   HTN/Obesity: -advised lifestyle and increase in medication last visit -she declined medication change - did not tolerate several meds in the past -meds now are norvasc 5 mg -reports: not exercise, did not change diet -denies: CP, SOb, DOE  Iron def Anemia: -advised GI and gyn eval -s/p normal EGD and colonosocpy 11/2016 -reports saw gyn and will be having [place] - considering hysterectomy for fibroids - but prefers to avoid surgery if possible -taking iron   ROS: See pertinent positives and negatives per HPI.  Past Medical History:  Diagnosis Date  . Anemia   . Fracture of spine, cervical, without spinal cord injury, closed ([place]) 05/31/2011  . Hypertension   . Neck injury     Past Surgical History:  Procedure Laterality Date  . POSTERIOR CERVICAL FUSION/FORAMINOTOMY  06/01/2011   Procedure: POSTERIOR CERVICAL FUSION/FORAMINOTOMY LEVEL 1;  Surgeon: Winfield Cunas, MD;  Location: [facility];  Service: Neurosurgery;  Laterality: N/A;  Posterior Cervical Fusion Cervical Six-Seven  . UTERINE FIBROID SURGERY     for heavy menstrual bleeding    Family History  Problem Relation Age of Onset  . Breast cancer Sister 6    died in 03-Jul-2008   . Hypertension Mother     all of family    Social History   Social History  . Marital status: Married    Spouse name: N/A  . Number of children: 0  . Years of education: N/A   Occupational History  . customer service    Social History Main Topics  . Smoking status: Never Smoker  . Smokeless tobacco: Never Used  . Alcohol use No  . Drug use: No  . Sexual activity: Not Asked   Other Topics Concern  . None   Social History Narrative   Work or [place] Situation: lives with husband      Spiritual Beliefs: Christian      Lifestyle: no regular exercise; diet is ok              Current Outpatient Prescriptions:  .  amLODipine  (NORVASC) 5 MG tablet, Take 1.5 tablets (7.5 mg total) by mouth daily., Disp: 90 tablet, Rfl: 1 .  Ferrous Sulfate (FEROSUL PO), Take 1 tsp by mouth every morning , Disp: , Rfl:   Current Facility-Administered Medications:  .  0.9 %  sodium chloride infusion, 500 mL, Intravenous, Continuous, Kavitha Nandigam V, MD  EXAM:  Vitals:   06/06/16 1343  BP: 122/90  Pulse: 92  Temp: 98.4 F (36.9 C)    Body mass index is 38.36 kg/m.  GENERAL: vitals reviewed and listed above, alert, oriented, appears well hydrated and in no acute distress  HEENT: atraumatic, conjunttiva clear, no obvious abnormalities on inspection of external nose and ears  NECK: no obvious masses on inspection  LUNGS: clear to auscultation bilaterally, no wheezes, rales or rhonchi, good air movement  CV: HRRR, no peripheral edema  MS: moves all extremities without noticeable abnormality  PSYCH: pleasant and cooperative, no obvious depression or anxiety  ASSESSMENT AND PLAN:  Discussed the following assessment and plan:  Essential hypertension - Plan: Basic metabolic panel  Class 2 obesity due to excess calories with serious comorbidity and body mass index (BMI) of 38.0 to 38.9 in adult  Iron deficiency anemia due to chronic blood loss - Plan: CBC  Uterine leiomyoma, unspecified location  -lifestyle  recs again, stressed importance healthy lifestyle -labs -increase norvasc to 7.5 -bp higher on my recheck then on arrival, follow up recheck in 1 month -Patient advised to return or notify a doctor immediately if symptoms worsen or persist or new concerns arise.  Patient Instructions  BEFORE YOU LEAVE: -follow up: 1 month -labs  INCREASE the norvasc to 7.5 mg (1 and 1/2 tablet daily in the morning) for the blood pressure.   We recommend the following healthy lifestyle for LIFE: 1) Small portions.   Tip: eat off of a salad plate instead of a dinner plate.  Tip: It is ok to feel hungry after a meal -  that likely means you ate an appropriate portion.  Tip: if you need more or a snack choose fruits, veggies and/or a handful of nuts or seeds.  2) Eat a healthy clean diet.  * Tip: Avoid (less then 1 serving per week): processed foods, sweets, sweetened drinks, white starches (rice, flour, bread, potatoes, pasta, etc), red meat, fast foods, butter  *Tip: CHOOSE instead   * 5-9 servings per day of fresh or frozen fruits and vegetables (but not corn, potatoes, bananas, canned or dried fruit)   *nuts and seeds, beans   *olives and olive oil   *small portions of lean meats such as fish and white chicken    *small portions of whole grains  3)Get at least 150 minutes of sweaty aerobic exercise per week.  4)Reduce stress - consider counseling, meditation and relaxation to balance other aspects of your life.  WE NOW OFFER   [facility] FAST TRACK!!!  SAME DAY Appointments for ACUTE CARE  Such as: Sprains, Injuries, cuts, abrasions, rashes, muscle pain, joint pain, back pain Colds, flu, sore throats, headache, allergies, cough, fever  Ear pain, sinus and eye infections Abdominal pain, nausea, vomiting, diarrhea, upset stomach Animal/insect bites  3 Easy Ways to Schedule: Walk-In Scheduling Call in scheduling Mychart Sign-up: [email or web address]           Colin Benton R., DO

## 2016-06-06 NOTE — Progress Notes (Signed)
Pre visit review using our clinic review tool, if applicable. No additional management support is needed unless otherwise documented below in the visit note. 

## 2016-06-06 NOTE — Patient Instructions (Signed)
BEFORE YOU LEAVE: -follow up: 1 month -labs  INCREASE the norvasc to 7.5 mg (1 and 1/2 tablet daily in the morning) for the blood pressure.   We recommend the following healthy lifestyle for LIFE: 1) Small portions.   Tip: eat off of a salad plate instead of a dinner plate.  Tip: It is ok to feel hungry after a meal - that likely means you ate an appropriate portion.  Tip: if you need more or a snack choose fruits, veggies and/or a handful of nuts or seeds.  2) Eat a healthy clean diet.  * Tip: Avoid (less then 1 serving per week): processed foods, sweets, sweetened drinks, white starches (rice, flour, bread, potatoes, pasta, etc), red meat, fast foods, butter  *Tip: CHOOSE instead   * 5-9 servings per day of fresh or frozen fruits and vegetables (but not corn, potatoes, bananas, canned or dried fruit)   *nuts and seeds, beans   *olives and olive oil   *small portions of lean meats such as fish and white chicken    *small portions of whole grains  3)Get at least 150 minutes of sweaty aerobic exercise per week.  4)Reduce stress - consider counseling, meditation and relaxation to balance other aspects of your life.  WE NOW OFFER   [facility] FAST TRACK!!!  SAME DAY Appointments for ACUTE CARE  Such as: Sprains, Injuries, cuts, abrasions, rashes, muscle pain, joint pain, back pain Colds, flu, sore throats, headache, allergies, cough, fever  Ear pain, sinus and eye infections Abdominal pain, nausea, vomiting, diarrhea, upset stomach Animal/insect bites  3 Easy Ways to Schedule: Walk-In Scheduling Call in scheduling Mychart Sign-up: [email or web address]

## 2016-06-08 ENCOUNTER — Other Ambulatory Visit: Payer: Self-pay | Admitting: Family Medicine

## 2016-07-16 NOTE — Progress Notes (Signed)
HPI:  Follow up HTN: -increased norvasc to 7.5 last visit - tolerating well -reports:doing great, has made sig changes in diet per the recommendations from her previous visit - more veggies and fruits, lean protein, less processed foods and less sugar and simple starches -still struggling to find time to exercise -has ? About MV and iron -denies: CP  ROS: See pertinent positives and negatives per HPI.  Past Medical History:  Diagnosis Date  . Anemia   . Fracture of spine, cervical, without spinal cord injury, closed ([place]) 05/31/2011  . Hypertension   . Neck injury     Past Surgical History:  Procedure Laterality Date  . POSTERIOR CERVICAL FUSION/FORAMINOTOMY  06/01/2011   Procedure: POSTERIOR CERVICAL FUSION/FORAMINOTOMY LEVEL 1;  Surgeon: Winfield Cunas, MD;  Location: [facility];  Service: Neurosurgery;  Laterality: N/A;  Posterior Cervical Fusion Cervical Six-Seven  . UTERINE FIBROID SURGERY     for heavy menstrual bleeding    Family History  Problem Relation Age of Onset  . Breast cancer Sister 59    died in 19-Jul-2008   . Hypertension Mother     all of family    Social History   Social History  . Marital status: Married    Spouse name: N/A  . Number of children: 0  . Years of education: N/A   Occupational History  . customer service    Social History Main Topics  . Smoking status: Never Smoker  . Smokeless tobacco: Never Used  . Alcohol use No  . Drug use: No  . Sexual activity: Not Asked   Other Topics Concern  . None   Social History Narrative   Work or [place] Situation: lives with husband      Spiritual Beliefs: Christian      Lifestyle: no regular exercise; diet is ok              Current Outpatient Prescriptions:  .  amLODipine (NORVASC) 5 MG tablet, Take 1.5 tablets (7.5 mg total) by mouth daily., Disp: 90 tablet, Rfl: 1 .  Ferrous Sulfate (FEROSUL PO), Take 1 tsp by mouth every morning , Disp: , Rfl:  .   IBUPROFEN PO, Take by mouth. 2 days prior to menstrual cycle, Disp: , Rfl:  .  Multiple Vitamin (MULTIVITAMIN) capsule, Take 1 capsule by mouth daily., Disp: , Rfl:   Current Facility-Administered Medications:  .  0.9 %  sodium chloride infusion, 500 mL, Intravenous, Continuous, Kavitha Nandigam V, MD  EXAM:  Vitals:   07/17/16 0931  BP: 124/84  Pulse: 92  Temp: 98.5 F (36.9 C)    Body mass index is 38.1 kg/m.  GENERAL: vitals reviewed and listed above, alert, oriented, appears well hydrated and in no acute distress  HEENT: atraumatic, conjunttiva clear, no obvious abnormalities on inspection of external nose and ears  NECK: no obvious masses on inspection  LUNGS: clear to auscultation bilaterally, no wheezes, rales or rhonchi, good air movement  CV: HRRR, no peripheral edema  MS: moves all extremities without noticeable abnormality  PSYCH: pleasant and cooperative, no obvious depression or anxiety  ASSESSMENT AND PLAN:  Discussed the following assessment and plan:  Essential hypertension - Plan: Basic metabolic panel, CBC  Class 2 obesity due to excess calories with serious comorbidity and body mass index (BMI) of 38.0 to 38.9 in adult  Iron deficiency anemia due to chronic blood loss  Encounter for herb and vitamin supplement management  -  congratulated and supported on diet changes, encouraged and advise regular aerobic exercise and discussed ways to fit activity into a busy life -counseled on vitamins/options/formulations/consumer labs reports and risks/benefits -labs today -continue norvasc 7.5 mg daily -follow up 3 months -Patient advised to return or notify a doctor immediately if symptoms worsen or persist or new concerns arise.  Patient Instructions  BEFORE YOU LEAVE: -labs -follow up: 3 months  Keep up the great work on eating a healthier diet! Please add in some exercise - every little bit counts!  Liquid iron if tolerated better.  Ensure  getting 1000 IU vit D3 daily ([employer]'s club, cosco or source naturals drops were highly rated by [employer])  We have ordered labs or studies at this visit. It can take up to 1-2 weeks for results and processing. IF results require follow up or explanation, we will call you with instructions. Clinically stable results will be released to your [facility]. If you have not heard from [place] or cannot find your results in [facility] in 2 weeks please contact our office at [phone].  If you are not yet signed up for [facility], please consider signing up.           Colin Benton R., DO

## 2016-07-17 ENCOUNTER — Ambulatory Visit (INDEPENDENT_AMBULATORY_CARE_PROVIDER_SITE_OTHER): Payer: 59 | Admitting: Family Medicine

## 2016-07-17 ENCOUNTER — Encounter: Payer: Self-pay | Admitting: Family Medicine

## 2016-07-17 VITALS — BP 124/84 | HR 92 | Temp 98.5°F | Ht 68.0 in | Wt 250.6 lb

## 2016-07-17 DIAGNOSIS — Z6838 Body mass index (BMI) 38.0-38.9, adult: Secondary | ICD-10-CM

## 2016-07-17 DIAGNOSIS — E6609 Other obesity due to excess calories: Secondary | ICD-10-CM

## 2016-07-17 DIAGNOSIS — I10 Essential (primary) hypertension: Secondary | ICD-10-CM | POA: Diagnosis not present

## 2016-07-17 DIAGNOSIS — IMO0001 Reserved for inherently not codable concepts without codable children: Secondary | ICD-10-CM

## 2016-07-17 DIAGNOSIS — Z7189 Other specified counseling: Secondary | ICD-10-CM | POA: Diagnosis not present

## 2016-07-17 DIAGNOSIS — D500 Iron deficiency anemia secondary to blood loss (chronic): Secondary | ICD-10-CM

## 2016-07-17 LAB — BASIC METABOLIC PANEL
BUN: 12 mg/dL (ref 6–23)
CALCIUM: 9.3 mg/dL (ref 8.4–10.5)
CO2: 27 mEq/L (ref 19–32)
CREATININE: 0.77 mg/dL (ref 0.40–1.20)
Chloride: 105 mEq/L (ref 96–112)
GFR: 102.22 mL/min (ref >60.00)
GLUCOSE: 89 mg/dL (ref 70–99)
Potassium: 4.3 mEq/L (ref 3.5–5.1)
Sodium: 137 mEq/L (ref 135–145)

## 2016-07-17 LAB — CBC
HCT: 41 % (ref 36.0–46.0)
Hemoglobin: 13.4 g/dL (ref 12.0–15.0)
MCHC: 32.7 g/dL (ref 30.0–36.0)
MCV: 84.9 fl (ref 78.0–100.0)
Platelets: 267 10*3/uL (ref 150.0–400.0)
RBC: 4.83 Mil/uL (ref 3.87–5.11)
RDW: 15.8 % — ABNORMAL HIGH (ref 11.5–15.5)
WBC: 3.6 10*3/uL — ABNORMAL LOW (ref 4.0–10.5)

## 2016-07-17 NOTE — Progress Notes (Signed)
Pre visit review using our clinic review tool, if applicable. No additional management support is needed unless otherwise documented below in the visit note. 

## 2016-07-17 NOTE — Patient Instructions (Signed)
BEFORE YOU LEAVE: -labs -follow up: 3 months  Keep up the great work on eating a healthier diet! Please add in some exercise - every little bit counts!  Liquid iron if tolerated better.  Ensure getting 1000 IU vit D3 daily ([employer]'s club, cosco or source naturals drops were highly rated by [employer])  We have ordered labs or studies at this visit. It can take up to 1-2 weeks for results and processing. IF results require follow up or explanation, we will call you with instructions. Clinically stable results will be released to your [facility]. If you have not heard from [place] or cannot find your results in [facility] in 2 weeks please contact our office at [phone].  If you are not yet signed up for [facility], please consider signing up.

## 2016-10-17 ENCOUNTER — Ambulatory Visit: Payer: 59 | Admitting: Family Medicine

## 2016-12-02 ENCOUNTER — Other Ambulatory Visit: Payer: Self-pay | Admitting: Family Medicine

## 2017-02-24 ENCOUNTER — Encounter: Payer: Self-pay | Admitting: Family Medicine

## 2017-02-24 ENCOUNTER — Ambulatory Visit: Payer: 59 | Admitting: Family Medicine

## 2017-02-24 VITALS — BP 150/100 | HR 83 | Temp 98.2°F | Ht 68.0 in | Wt 252.4 lb

## 2017-02-24 DIAGNOSIS — I10 Essential (primary) hypertension: Secondary | ICD-10-CM | POA: Diagnosis not present

## 2017-02-24 DIAGNOSIS — Z23 Encounter for immunization: Secondary | ICD-10-CM | POA: Diagnosis not present

## 2017-02-24 DIAGNOSIS — R21 Rash and other nonspecific skin eruption: Secondary | ICD-10-CM

## 2017-02-24 DIAGNOSIS — Z6838 Body mass index (BMI) 38.0-38.9, adult: Secondary | ICD-10-CM | POA: Diagnosis not present

## 2017-02-24 MED ORDER — AMLODIPINE BESYLATE 5 MG PO TABS: ORAL_TABLET | ORAL | 3 refills | Status: DC

## 2017-02-24 MED ORDER — TRIAMCINOLONE ACETONIDE 0.1 % EX CREA: 1.0000 "application " | TOPICAL_CREAM | Freq: Two times a day (BID) | CUTANEOUS | 0 refills | Status: DC

## 2017-02-24 NOTE — Patient Instructions (Signed)
BEFORE YOU LEAVE: -labs -follow up: 1 month to recheck blood pressure and the rash  For the rash: -Combined Aquaphor with the triamcinolone cream that I sent to the pharmacy and apply twice daily for 2 weeks, then apply Aquaphor daily -Use a hypoallergenic on no fragrance soap and detergent  Start your blood pressure medication and take it every day  Schedule your mammogram  We have ordered labs or studies at this visit. It can take up to 1-2 weeks for results and processing. IF results require follow up or explanation, we will call you with instructions. Clinically stable results will be released to your [facility]. If you have not heard from [place] or cannot find your results in [facility] in 2 weeks please contact our office at [phone].  If you are not yet signed up for [facility], please consider signing up.        We recommend the following healthy lifestyle for LIFE: 1) Small portions. But, make sure to get regular (at least 3 per day), healthy meals and small healthy snacks if needed.  2) Eat a healthy clean diet.   TRY TO EAT: -at least 5-7 servings of low sugar, colorful, and nutrient rich vegetables per day (not corn, potatoes or bananas.) -berries are the best choice if you wish to eat fruit (only eat small amounts if trying to reduce weight)  -lean meets (fish, white meat of chicken or [place]) -vegan proteins for some meals - beans or tofu, whole grains, nuts and seeds -Replace bad fats with good fats - good fats include: fish, nuts and seeds, canola oil, olive oil -small amounts of low fat or non fat dairy -small amounts of100 % whole grains - check the lables -drink plenty of water  AVOID: -SUGAR, sweets, anything with added sugar, corn syrup or sweeteners - must read labels as even foods advertised as "healthy" often are loaded with sugar -if you must have a sweetener, small amounts of stevia may be best -sweetened beverages and artificially sweetened beverages -simple  starches (rice, bread, potatoes, pasta, chips, etc - small amounts of 100% whole grains are ok) -red meat, pork, butter -fried foods, fast food, processed food, excessive dairy, eggs and coconut.  3)Get at least 150 minutes of sweaty aerobic exercise per week.  4)Reduce stress - consider counseling, meditation and relaxation to balance other aspects of your life.

## 2017-02-24 NOTE — Progress Notes (Signed)
HPI:  Acute visit for refill of blood pressure medications.  Reports she traveled to see her parents recently and ran out of her blood pressure medicines.  She has been out for some time. She has a new rash the last week.  This is very itchy on her arms extremities and trunk.  She has dry skin.  ROS: See pertinent positives and negatives per HPI.  Past Medical History:  Diagnosis Date  . Anemia   . Fracture of spine, cervical, without spinal cord injury, closed ([place]) 05/31/2011  . Hypertension   . Neck injury     Past Surgical History:  Procedure Laterality Date  . POSTERIOR CERVICAL FUSION/FORAMINOTOMY  06/01/2011   Procedure: POSTERIOR CERVICAL FUSION/FORAMINOTOMY LEVEL 1;  Surgeon: Winfield Cunas, MD;  Location: [place] [facility];  Service: Neurosurgery;  Laterality: N/A;  Posterior Cervical Fusion Cervical Six-Seven  . UTERINE FIBROID SURGERY     for heavy menstrual bleeding    Family History  Problem Relation Age of Onset  . Breast cancer Sister 46       died in Jul 05, 2008   . Hypertension Mother        all of family    Social History   Socioeconomic History  . Marital status: Married    Spouse name: None  . Number of children: 0  . Years of education: None  . Highest education level: None  Social Needs  . Financial resource strain: None  . Food insecurity - worry: None  . Food insecurity - inability: None  . Transportation needs - medical: None  . Transportation needs - non-medical: None  Occupational History  . Occupation: customer service  Tobacco Use  . Smoking status: Never Smoker  . Smokeless tobacco: Never Used  Substance and Sexual Activity  . Alcohol use: No  . Drug use: No  . Sexual activity: None  Other Topics Concern  . None  Social History Narrative   Work or [place] Situation: lives with husband      Spiritual Beliefs: Christian      Lifestyle: no regular exercise; diet is ok              Current Outpatient  Medications:  .  amLODipine (NORVASC) 5 MG tablet, TAKE 1 AND 1/2 TABLETS(7.5 MG) BY MOUTH DAILY, Disp: 135 tablet, Rfl: 3 .  Ferrous Sulfate (FEROSUL PO), Take 1 tsp by mouth every morning , Disp: , Rfl:  .  IBUPROFEN PO, Take by mouth. 3 At beginning of menstrual cycle, Disp: , Rfl:  .  Multiple Vitamin (MULTIVITAMIN) capsule, Take 1 capsule by mouth daily., Disp: , Rfl:  .  triamcinolone cream (KENALOG) 0.1 %, Apply 1 application topically 2 (two) times daily., Disp: 30 g, Rfl: 0  Current Facility-Administered Medications:  .  0.9 %  sodium chloride infusion, 500 mL, Intravenous, Continuous, Nandigam, Kavitha V, MD  EXAM:  Vitals:   02/24/17 1528 02/24/17 1541  BP: (!) 140/100 (S) (!) 150/100  Pulse: 83   Temp: 98.2 F (36.8 C)     Body mass index is 38.38 kg/m.  GENERAL: vitals reviewed and listed above, alert, oriented, appears well hydrated and in no acute distress  HEENT: atraumatic, conjunttiva clear, no obvious abnormalities on inspection of external nose and ears  NECK: no obvious masses on inspection  LUNGS: clear to auscultation bilaterally, no wheezes, rales or rhonchi, good air movement  CV: HRRR, no peripheral edema  MS: moves all  extremities without noticeable abnormality  SKIN: fine papular rash, dry skin - arms, trunk  PSYCH: pleasant and cooperative, no obvious depression or anxiety  ASSESSMENT AND PLAN:  Discussed the following assessment and plan:  Essential hypertension - Plan: Basic metabolic panel, CBC  Rash  BMI 38.0-38.9,adult  -labs -refill BP meds -lifestyle recs -steroid ointment, emollient, hypoallergenic regimen for likely eczema -follow up 1 month -Patient advised to return or notify a doctor immediately if symptoms worsen or persist or new concerns arise.  Patient Instructions  BEFORE YOU LEAVE: -labs -follow up: 1 month to recheck blood pressure and the rash  For the rash: -Combined Aquaphor with the triamcinolone cream  that I sent to the pharmacy and apply twice daily for 2 weeks, then apply Aquaphor daily -Use a hypoallergenic on no fragrance soap and detergent  Start your blood pressure medication and take it every day  Schedule your mammogram  We have ordered labs or studies at this visit. It can take up to 1-2 weeks for results and processing. IF results require follow up or explanation, we will call you with instructions. Clinically stable results will be released to your [facility]. If you have not heard from [place] or cannot find your results in [facility] in 2 weeks please contact our office at [phone].  If you are not yet signed up for [facility], please consider signing up.        We recommend the following healthy lifestyle for LIFE: 1) Small portions. But, make sure to get regular (at least 3 per day), healthy meals and small healthy snacks if needed.  2) Eat a healthy clean diet.   TRY TO EAT: -at least 5-7 servings of low sugar, colorful, and nutrient rich vegetables per day (not corn, potatoes or bananas.) -berries are the best choice if you wish to eat fruit (only eat small amounts if trying to reduce weight)  -lean meets (fish, white meat of chicken or [place]) -vegan proteins for some meals - beans or tofu, whole grains, nuts and seeds -Replace bad fats with good fats - good fats include: fish, nuts and seeds, canola oil, olive oil -small amounts of low fat or non fat dairy -small amounts of100 % whole grains - check the lables -drink plenty of water  AVOID: -SUGAR, sweets, anything with added sugar, corn syrup or sweeteners - must read labels as even foods advertised as "healthy" often are loaded with sugar -if you must have a sweetener, small amounts of stevia may be best -sweetened beverages and artificially sweetened beverages -simple starches (rice, bread, potatoes, pasta, chips, etc - small amounts of 100% whole grains are ok) -red meat, pork, butter -fried foods, fast food,  processed food, excessive dairy, eggs and coconut.  3)Get at least 150 minutes of sweaty aerobic exercise per week.  4)Reduce stress - consider counseling, meditation and relaxation to balance other aspects of your life.     Colin Benton R., DO

## 2017-02-24 NOTE — Addendum Note (Signed)
Addended by: Agnes Lawrence on: 02/24/2017 04:33 PM   Modules accepted: Orders

## 2017-02-25 LAB — CBC
HCT: 42.1 % (ref 36.0–46.0)
Hemoglobin: 13.9 g/dL (ref 12.0–15.0)
MCHC: 32.9 g/dL (ref 30.0–36.0)
MCV: 89.5 fl (ref 78.0–100.0)
PLATELETS: 288 10*3/uL (ref 150.0–400.0)
RBC: 4.7 Mil/uL (ref 3.87–5.11)
RDW: 13.3 % (ref 11.5–15.5)
WBC: 5.7 10*3/uL (ref 4.0–10.5)

## 2017-02-25 LAB — BASIC METABOLIC PANEL
BUN: 15 mg/dL (ref 6–23)
CALCIUM: 10.3 mg/dL (ref 8.4–10.5)
CO2: 28 meq/L (ref 19–32)
Chloride: 101 mEq/L (ref 96–112)
Creatinine, Ser: 0.85 mg/dL (ref 0.40–1.20)
GFR: 90.97 mL/min (ref >60.00)
GLUCOSE: 88 mg/dL (ref 70–99)
Potassium: 4.1 mEq/L (ref 3.5–5.1)
SODIUM: 135 meq/L (ref 135–145)

## 2017-03-03 ENCOUNTER — Other Ambulatory Visit: Payer: Self-pay | Admitting: Family Medicine

## 2017-04-02 NOTE — Progress Notes (Signed)
HPI:  Due for mammogram.  Here for follow up.  Follow up HTN: -had run out of medications at last visit -reports:doing much better, restarted medication -just started working on diet and exercise and prefers to work on this rather then adding more medicine -denies chest pain, shortness of breath, headache, swelling  Skin rash: -advised emollient, hypoallergenic regimen, steroid -Rash resolved  ROS: See pertinent positives and negatives per HPI.  Past Medical History:  Diagnosis Date  . Anemia   . Fracture of spine, cervical, without spinal cord injury, closed ([place]) 05/31/2011  . Hypertension   . Neck injury     Past Surgical History:  Procedure Laterality Date  . POSTERIOR CERVICAL FUSION/FORAMINOTOMY  06/01/2011   Procedure: POSTERIOR CERVICAL FUSION/FORAMINOTOMY LEVEL 1;  Surgeon: Winfield Cunas, MD;  Location: [place] [facility];  Service: Neurosurgery;  Laterality: N/A;  Posterior Cervical Fusion Cervical Six-Seven  . UTERINE FIBROID SURGERY     for heavy menstrual bleeding    Family History  Problem Relation Age of Onset  . Breast cancer Sister 75       died in Jul 24, 2008   . Hypertension Mother        all of family    Social History   Socioeconomic History  . Marital status: Married    Spouse name: None  . Number of children: 0  . Years of education: None  . Highest education level: None  Social Needs  . Financial resource strain: None  . Food insecurity - worry: None  . Food insecurity - inability: None  . Transportation needs - medical: None  . Transportation needs - non-medical: None  Occupational History  . Occupation: customer service  Tobacco Use  . Smoking status: Never Smoker  . Smokeless tobacco: Never Used  Substance and Sexual Activity  . Alcohol use: No  . Drug use: No  . Sexual activity: None  Other Topics Concern  . None  Social History Narrative   Work or [place] Situation: lives with husband      Spiritual  Beliefs: Christian      Lifestyle: no regular exercise; diet is ok              Current Outpatient Medications:  .  amLODipine (NORVASC) 5 MG tablet, TAKE 1 AND 1/2 TABLETS(7.5 MG) BY MOUTH DAILY, Disp: 135 tablet, Rfl: 3 .  Ferrous Sulfate (FEROSUL PO), Take 1 tsp by mouth every morning , Disp: , Rfl:  .  IBUPROFEN PO, Take by mouth. 3 At beginning of menstrual cycle, Disp: , Rfl:  .  Multiple Vitamin (MULTIVITAMIN) capsule, Take 1 capsule by mouth daily., Disp: , Rfl:  .  triamcinolone cream (KENALOG) 0.1 %, APPLY EXTERNALLY TO THE AFFECTED AREA TWICE DAILY, Disp: 30 g, Rfl: 0  Current Facility-Administered Medications:  .  0.9 %  sodium chloride infusion, 500 mL, Intravenous, Continuous, Nandigam, Kavitha V, MD  EXAM:  Vitals:   04/03/17 1023  BP: 122/84  Pulse: 87  Temp: 98.3 F (36.8 C)    Body mass index is 38.71 kg/m.  GENERAL: vitals reviewed and listed above, alert, oriented, appears well hydrated and in no acute distress  HEENT: atraumatic, conjunttiva clear, no obvious abnormalities on inspection of external nose and ears  NECK: no obvious masses on inspection  LUNGS: clear to auscultation bilaterally, no wheezes, rales or rhonchi, good air movement  CV: HRRR, no peripheral edema  MS: moves all extremities without noticeable abnormality  PSYCH: pleasant and cooperative, no obvious depression or anxiety  ASSESSMENT AND PLAN:  Discussed the following assessment and plan:  Essential hypertension  Skin rash  BMI 38.0-38.9,adult  -Continue blood pressure medication, blood pressure still not quite at goal, but much better -Discussed treatment options, she prefers to work on lifestyle changes -We discussed a healthy low sugar diet and goals for aerobic exercise, advised a 10-20 pound weight loss over the next day, follow-up in 3-4 months -Advised to schedule mammogram -Glad the rash is resolved -Patient advised to return or notify a doctor immediately  if symptoms worsen or persist or new concerns arise.  Patient Instructions  BEFORE YOU LEAVE: -follow up: 3-4 months  Continue the Norvasc and take every day.   We recommend the following healthy lifestyle for LIFE: 1) Small portions. But, make sure to get regular (at least 3 per day), healthy meals and small healthy snacks if needed.  2) Eat a healthy clean diet.   TRY TO EAT: -at least 5-7 servings of low sugar, colorful, and nutrient rich vegetables per day (not corn, potatoes or bananas.) -berries are the best choice if you wish to eat fruit (only eat small amounts if trying to reduce weight)  -lean meets (fish, white meat of chicken or [place]) -vegan proteins for some meals - beans or tofu, whole grains, nuts and seeds -Replace bad fats with good fats - good fats include: fish, nuts and seeds, canola oil, olive oil -small amounts of low fat or non fat dairy -small amounts of100 % whole grains - check the lables -drink plenty of water  AVOID: -SUGAR, sweets, anything with added sugar, corn syrup or sweeteners - must read labels as even foods advertised as "healthy" often are loaded with sugar -if you must have a sweetener, small amounts of stevia may be best -sweetened beverages and artificially sweetened beverages -simple starches (rice, bread, potatoes, pasta, chips, etc - small amounts of 100% whole grains are ok) -red meat, pork, butter -fried foods, fast food, processed food, excessive dairy, eggs and coconut.  3)Get at least 150 minutes of sweaty aerobic exercise per week.  4)Reduce stress - consider counseling, meditation and relaxation to balance other aspects of your life.     Colin Benton R., DO

## 2017-04-03 ENCOUNTER — Ambulatory Visit: Payer: 59 | Admitting: Family Medicine

## 2017-04-03 ENCOUNTER — Encounter: Payer: Self-pay | Admitting: Family Medicine

## 2017-04-03 VITALS — BP 122/84 | HR 87 | Temp 98.3°F | Ht 68.0 in | Wt 254.6 lb

## 2017-04-03 DIAGNOSIS — Z6838 Body mass index (BMI) 38.0-38.9, adult: Secondary | ICD-10-CM

## 2017-04-03 DIAGNOSIS — I10 Essential (primary) hypertension: Secondary | ICD-10-CM | POA: Diagnosis not present

## 2017-04-03 DIAGNOSIS — R21 Rash and other nonspecific skin eruption: Secondary | ICD-10-CM

## 2017-04-03 NOTE — Patient Instructions (Signed)
BEFORE YOU LEAVE: -follow up: 3-4 months  Continue the Norvasc and take every day.   We recommend the following healthy lifestyle for LIFE: 1) Small portions. But, make sure to get regular (at least 3 per day), healthy meals and small healthy snacks if needed.  2) Eat a healthy clean diet.   TRY TO EAT: -at least 5-7 servings of low sugar, colorful, and nutrient rich vegetables per day (not corn, potatoes or bananas.) -berries are the best choice if you wish to eat fruit (only eat small amounts if trying to reduce weight)  -lean meets (fish, white meat of chicken or [place]) -vegan proteins for some meals - beans or tofu, whole grains, nuts and seeds -Replace bad fats with good fats - good fats include: fish, nuts and seeds, canola oil, olive oil -small amounts of low fat or non fat dairy -small amounts of100 % whole grains - check the lables -drink plenty of water  AVOID: -SUGAR, sweets, anything with added sugar, corn syrup or sweeteners - must read labels as even foods advertised as "healthy" often are loaded with sugar -if you must have a sweetener, small amounts of stevia may be best -sweetened beverages and artificially sweetened beverages -simple starches (rice, bread, potatoes, pasta, chips, etc - small amounts of 100% whole grains are ok) -red meat, pork, butter -fried foods, fast food, processed food, excessive dairy, eggs and coconut.  3)Get at least 150 minutes of sweaty aerobic exercise per week.  4)Reduce stress - consider counseling, meditation and relaxation to balance other aspects of your life.

## 2017-06-05 DIAGNOSIS — Z803 Family history of malignant neoplasm of breast: Secondary | ICD-10-CM | POA: Diagnosis not present

## 2017-06-05 DIAGNOSIS — Z1231 Encounter for screening mammogram for malignant neoplasm of breast: Secondary | ICD-10-CM | POA: Diagnosis not present

## 2017-06-08 DIAGNOSIS — Z01419 Encounter for gynecological examination (general) (routine) without abnormal findings: Secondary | ICD-10-CM | POA: Diagnosis not present

## 2017-07-02 ENCOUNTER — Ambulatory Visit: Payer: 59 | Admitting: Family Medicine

## 2017-07-30 ENCOUNTER — Ambulatory Visit: Payer: 59 | Admitting: Family Medicine

## 2017-07-30 ENCOUNTER — Encounter: Payer: Self-pay | Admitting: Family Medicine

## 2017-07-30 VITALS — BP 136/86 | HR 83 | Temp 98.6°F | Ht 68.0 in | Wt 250.9 lb

## 2017-07-30 DIAGNOSIS — I10 Essential (primary) hypertension: Secondary | ICD-10-CM | POA: Diagnosis not present

## 2017-07-30 NOTE — Patient Instructions (Signed)
BEFORE YOU LEAVE: -labs -refer for mammogram -follow up: 30 minutes in 1 month - bring diet log  We have ordered labs or studies at this visit. It can take up to 1-2 weeks for results and processing. IF results require follow up or explanation, we will call you with instructions. Clinically stable results will be released to your [facility]. If you have not heard from [place] or cannot find your results in [facility] in 2 weeks please contact our office at [phone].  If you are not yet signed up for [facility], please consider signing up.          We recommend the following healthy lifestyle for LIFE: 1) Small portions. But, make sure to get regular (at least 3 per day), healthy meals and small healthy snacks if needed.  2) Eat a healthy clean diet.   TRY TO EAT: -at least 5-7 servings of low sugar, colorful, and nutrient rich vegetables per day (not corn, potatoes or bananas.) -berries are the best choice if you wish to eat fruit (only eat small amounts if trying to reduce weight)  -lean meets (fish, white meat of chicken or [place]) -vegan proteins for some meals - beans or tofu, whole grains, nuts and seeds -Replace bad fats with good fats - good fats include: fish, nuts and seeds, canola oil, olive oil -small amounts of low fat or non fat dairy -small amounts of100 % whole grains - check the lables -drink plenty of water  AVOID: -SUGAR, sweets, anything with added sugar, corn syrup or sweeteners - must read labels as even foods advertised as "healthy" often are loaded with sugar -if you must have a sweetener, small amounts of stevia may be best -sweetened beverages and artificially sweetened beverages -simple starches (rice, bread, potatoes, pasta, chips, etc - small amounts of 100% whole grains are ok) -red meat, pork, butter -fried foods, fast food, processed food, excessive dairy, eggs and coconut.  3)Get at least 150 minutes of sweaty aerobic exercise per week.  4)Reduce stress  - consider counseling, meditation and relaxation to balance other aspects of your life.

## 2017-07-30 NOTE — Progress Notes (Signed)
HPI:  Using dictation device. Unfortunately this device frequently misinterprets words/phrases.  Follow up: Due for labs and mammo.  HTN: -meds: norvasc 7.5 mg -does not want to take other meds -hxtz cause SOB, other meds made her sleepy  Morbid obesity: -working on diet the last 1 month -has reduced sugar intake an dis eating more lean meats and veggies -walking 20 minutes daily -wt 1/19 254 --> 5/19 250  ROS: See pertinent positives and negatives per HPI.  Past Medical History:  Diagnosis Date  . Anemia   . Fracture of spine, cervical, without spinal cord injury, closed ([place]) 05/31/2011  . Hypertension   . Neck injury     Past Surgical History:  Procedure Laterality Date  . POSTERIOR CERVICAL FUSION/FORAMINOTOMY  06/01/2011   Procedure: POSTERIOR CERVICAL FUSION/FORAMINOTOMY LEVEL 1;  Surgeon: Winfield Cunas, MD;  Location: [place] [facility];  Service: Neurosurgery;  Laterality: N/A;  Posterior Cervical Fusion Cervical Six-Seven  . UTERINE FIBROID SURGERY     for heavy menstrual bleeding    Family History  Problem Relation Age of Onset  . Breast cancer Sister 61       died in 07-23-08   . Hypertension Mother        all of family    SOCIAL HX:    Current Outpatient Medications:  .  amLODipine (NORVASC) 5 MG tablet, TAKE 1 AND 1/2 TABLETS(7.5 MG) BY MOUTH DAILY, Disp: 135 tablet, Rfl: 3 .  Ferrous Sulfate (FEROSUL PO), Take 1 tsp by mouth every morning , Disp: , Rfl:  .  IBUPROFEN PO, Take by mouth. 3 At beginning of menstrual cycle, Disp: , Rfl:  .  Multiple Vitamin (MULTIVITAMIN) capsule, Take 1 capsule by mouth daily., Disp: , Rfl:   Current Facility-Administered Medications:  .  0.9 %  sodium chloride infusion, 500 mL, Intravenous, Continuous, Nandigam, Kavitha V, MD  EXAM:  Vitals:   07/30/17 1551  BP: 136/86  Pulse: 83  Temp: 98.6 F (37 C)    Body mass index is 38.15 kg/m.  GENERAL: vitals reviewed and listed above, alert, oriented, appears well  hydrated and in no acute distress  HEENT: atraumatic, conjunttiva clear, no obvious abnormalities on inspection of external nose and ears  NECK: no obvious masses on inspection  LUNGS: clear to auscultation bilaterally, no wheezes, rales or rhonchi, good air movement  CV: HRRR, no peripheral edema  MS: moves all extremities without noticeable abnormality  PSYCH: pleasant and cooperative, no obvious depression or anxiety  ASSESSMENT AND PLAN:  Discussed the following assessment and plan: More than 50% of over 25 minutes spent in total in caring for this patient was spent face-to-face with the patient, counseling and/or coordinating care.   Essential hypertension - Plan: Basic metabolic panel, CBC  Morbid obesity ([place]) - Plan: Hemoglobin A1c, HDL cholesterol, Cholesterol, total  -discussed BP management/risks; she does not want to take other medications, prefers to work on lifestyle -healthy diet and regular exercise advise - discussed at length, cogratulated on changes, 5 year plan advised -follow up 1 month -labs today -Patient advised to return or notify a doctor immediately if symptoms worsen or persist or new concerns arise.  Patient Instructions  BEFORE YOU LEAVE: -labs -refer for mammogram -follow up: 30 minutes in 1 month - bring diet log  We have ordered labs or studies at this visit. It can take up to 1-2 weeks for results and processing. IF results require follow up or explanation, we will call you with instructions.  Clinically stable results will be released to your [facility]. If you have not heard from [place] or cannot find your results in [facility] in 2 weeks please contact our office at [phone].  If you are not yet signed up for [facility], please consider signing up.          We recommend the following healthy lifestyle for LIFE: 1) Small portions. But, make sure to get regular (at least 3 per day), healthy meals and small healthy snacks if needed.  2) Eat a  healthy clean diet.   TRY TO EAT: -at least 5-7 servings of low sugar, colorful, and nutrient rich vegetables per day (not corn, potatoes or bananas.) -berries are the best choice if you wish to eat fruit (only eat small amounts if trying to reduce weight)  -lean meets (fish, white meat of chicken or [place]) -vegan proteins for some meals - beans or tofu, whole grains, nuts and seeds -Replace bad fats with good fats - good fats include: fish, nuts and seeds, canola oil, olive oil -small amounts of low fat or non fat dairy -small amounts of100 % whole grains - check the lables -drink plenty of water  AVOID: -SUGAR, sweets, anything with added sugar, corn syrup or sweeteners - must read labels as even foods advertised as "healthy" often are loaded with sugar -if you must have a sweetener, small amounts of stevia may be best -sweetened beverages and artificially sweetened beverages -simple starches (rice, bread, potatoes, pasta, chips, etc - small amounts of 100% whole grains are ok) -red meat, pork, butter -fried foods, fast food, processed food, excessive dairy, eggs and coconut.  3)Get at least 150 minutes of sweaty aerobic exercise per week.  4)Reduce stress - consider counseling, meditation and relaxation to balance other aspects of your life.     Lucretia Kern, DO

## 2017-07-31 LAB — CHOLESTEROL, TOTAL: CHOLESTEROL: 142 mg/dL (ref 0–200)

## 2017-07-31 LAB — CBC
HCT: 42.6 % (ref 36.0–46.0)
Hemoglobin: 14.3 g/dL (ref 12.0–15.0)
MCHC: 33.5 g/dL (ref 30.0–36.0)
MCV: 87.3 fl (ref 78.0–100.0)
Platelets: 299 10*3/uL (ref 150.0–400.0)
RBC: 4.88 Mil/uL (ref 3.87–5.11)
RDW: 12.5 % (ref 11.5–15.5)
WBC: 4.4 10*3/uL (ref 4.0–10.5)

## 2017-07-31 LAB — BASIC METABOLIC PANEL
BUN: 12 mg/dL (ref 6–23)
CALCIUM: 10.2 mg/dL (ref 8.4–10.5)
CO2: 29 meq/L (ref 19–32)
CREATININE: 0.84 mg/dL (ref 0.40–1.20)
Chloride: 100 mEq/L (ref 96–112)
GFR: 92.07 mL/min (ref >60.00)
GLUCOSE: 87 mg/dL (ref 70–99)
Potassium: 4.5 mEq/L (ref 3.5–5.1)
Sodium: 138 mEq/L (ref 135–145)

## 2017-07-31 LAB — HEMOGLOBIN A1C: Hgb A1c MFr Bld: 5.8 % (ref 4.6–6.5)

## 2017-07-31 LAB — HDL CHOLESTEROL: HDL: 60.8 mg/dL (ref >39.00)

## 2017-09-10 ENCOUNTER — Ambulatory Visit: Payer: 59 | Admitting: Family Medicine

## 2017-09-24 ENCOUNTER — Ambulatory Visit: Payer: 59 | Admitting: Family Medicine

## 2018-03-02 ENCOUNTER — Other Ambulatory Visit: Payer: Self-pay | Admitting: *Deleted

## 2018-03-02 MED ORDER — AMLODIPINE BESYLATE 5 MG PO TABS: ORAL_TABLET | ORAL | 0 refills | Status: DC

## 2018-03-02 NOTE — Telephone Encounter (Signed)
Rx done. 

## 2018-03-29 ENCOUNTER — Telehealth: Payer: Self-pay | Admitting: Family Medicine

## 2018-03-29 NOTE — Telephone Encounter (Signed)
Rx refilled on 03-02-18 for 45 tablets with note that patient needs an appointment. / No appointment scheduled.  Attempted to call patient to schedule an appointment no answer /

## 2018-03-29 NOTE — Telephone Encounter (Signed)
Copied from [place] [id number]. Topic: Quick Communication - Rx Refill/Question >> Mar 29, 2018 11:52 AM Reyne Dumas L wrote: Medication:  amLODipine (NORVASC) 5 MG tablet   Has the patient contacted their pharmacy? Yes - states they haven't heard from [place] and she only has one pill left for tomorrow. (Agent: If no, request that the patient contact the pharmacy for the refill.) (Agent: If yes, when and what did the pharmacy advise?)  Preferred Pharmacy (with phone number or street name): [facility] [place], [place] [address] [place] [phone] (Phone) [phone] (Fax)  Agent: Please be advised that RX refills may take up to 3 business days. We ask that you follow-up with your pharmacy.

## 2018-04-01 ENCOUNTER — Other Ambulatory Visit: Payer: Self-pay | Admitting: *Deleted

## 2018-04-01 MED ORDER — AMLODIPINE BESYLATE 5 MG PO TABS: ORAL_TABLET | ORAL | 0 refills | Status: DC

## 2018-04-01 NOTE — Telephone Encounter (Signed)
Rx done. 

## 2018-04-07 ENCOUNTER — Encounter: Payer: Self-pay | Admitting: Family Medicine

## 2018-04-07 NOTE — Progress Notes (Signed)
HPI:  Using dictation device. Unfortunately this device frequently misinterprets words/phrases.  Olivia West is a pleasant 53 y.o. here for follow up. Chronic medical problems summarized below were reviewed for changes and stability and were updated as needed below. These issues and their treatment remain stable for the most part. Doing well.  Reports though weight dose not show it she has been eating much better and feels much better with energy much better. Denies CP, SOB, DOE, treatment intolerance or new symptoms. Due for labs, mammo, flu vaccine We have advised her to get her mammogram in the past, she reports today that she does it every year and will call to see if due as she reports she is sure she did it last year. She decline [place] assisting with this. Due for CPE next visit.  HTN: -meds: norvasc 7.5 mg -does not want to take other meds -hxtz cause SOB, other meds made her sleepy  Morbid obesity - BMI >35 with hyperglycemia and HTN: -working on diet the last 1 month -has reduced sugar intake and is eating more lean meats and veggies -walking 20 minutes daily -wt 1/19 254 --> 5/19 250 --> 256 1/20  ROS: See pertinent positives and negatives per HPI.  Past Medical History:  Diagnosis Date  . Class 2 obesity due to excess calories with serious comorbidity and body mass index (BMI) of 38.0 to 38.9 in adult 02/08/2016  . Fracture of spine, cervical, without spinal cord injury, closed ([place]) 05/31/2011  . Hypertension   . Iron deficiency anemia due to chronic blood loss 06/06/2016  . Neck injury   . Uterine leiomyoma 06/06/2016    Past Surgical History:  Procedure Laterality Date  . POSTERIOR CERVICAL FUSION/FORAMINOTOMY  06/01/2011   Procedure: POSTERIOR CERVICAL FUSION/FORAMINOTOMY LEVEL 1;  Surgeon: Winfield Cunas, MD;  Location: [facility];  Service: Neurosurgery;  Laterality: N/A;  Posterior Cervical Fusion Cervical Six-Seven  . UTERINE FIBROID SURGERY     for heavy menstrual  bleeding    Family History  Problem Relation Age of Onset  . Breast cancer Sister 41       died in 07/10/08   . Hypertension Mother        all of family    SOCIAL HX: see hpi   Current Outpatient Medications:  .  amLODipine (NORVASC) 5 MG tablet, TAKE 1 AND 1/2 TABLETS(7.5 MG) BY MOUTH DAILY, Disp: 45 tablet, Rfl: 0 .  Ferrous Sulfate (FEROSUL PO), Take 1 tsp by mouth every morning , Disp: , Rfl:  .  IBUPROFEN PO, Take by mouth. 3 At beginning of menstrual cycle, Disp: , Rfl:  .  Multiple Vitamin (MULTIVITAMIN) capsule, Take 1 capsule by mouth daily., Disp: , Rfl:   Current Facility-Administered Medications:  .  0.9 %  sodium chloride infusion, 500 mL, Intravenous, Continuous, Nandigam, Kavitha V, MD  EXAM:  Vitals:   04/08/18 0713  BP: 116/80  Pulse: 88  Temp: 98.3 F (36.8 C)    Body mass index is 39.05 kg/m.  GENERAL: vitals reviewed and listed above, alert, oriented, appears well hydrated and in no acute distress  HEENT: atraumatic, conjunttiva clear, no obvious abnormalities on inspection of external nose and ears  NECK: no obvious masses on inspection  LUNGS: clear to auscultation bilaterally, no wheezes, rales or rhonchi, good air movement  CV: HRRR, no peripheral edema  MS: moves all extremities without noticeable abnormality  PSYCH: pleasant and cooperative, no obvious depression or anxiety  ASSESSMENT AND PLAN:  Discussed the following assessment and plan:  Essential hypertension - Plan: Basic metabolic panel  Morbid obesity (HCC)  Iron deficiency anemia due to chronic blood loss - Plan: CBC  -discussed a healthy diet and various types of diets at length - advised Mediterranean diet, healthy low sugar diet, whole foods and regular exercise -cont current med -advised mammogram yearly and offered to set up - she declined but agreed to call herself -labs today -flu shot today per her request -CPE in 3 months - f/u sooner as needed  Patient  Instructions  BEFORE YOU LEAVE: -labs -flu shot -follow up: CPE in 3 months  Get your mammogram every year.  Please loose 20 lbs over the next 3-6 months.  We have ordered labs or studies at this visit. It can take up to 1-2 weeks for results and processing. IF results require follow up or explanation, we will call you with instructions. Clinically stable results will be released to your [facility]. If you have not heard from [place] or cannot find your results in [facility] in 2 weeks please contact our office at [phone].  If you are not yet signed up for [facility], please consider signing up.   We recommend the following healthy lifestyle for LIFE: 1) Small portions. But, make sure to get regular (at least 3 per day), healthy meals and small healthy snacks if needed.  2) Eat a healthy clean diet.   TRY TO EAT: -at least 5-7 servings of low sugar, colorful, and nutrient rich vegetables per day (not corn, potatoes or bananas.) -berries are the best choice if you wish to eat fruit (only eat small amounts if trying to reduce weight)  -lean meets (fish, white meat of chicken or [place]) -vegan proteins for some meals - beans or tofu, whole grains, nuts and seeds -Replace bad fats with good fats - good fats include: fish, nuts and seeds, canola oil, olive oil -small amounts of low fat or non fat dairy -small amounts of100 % whole grains - check the lables -drink plenty of water  AVOID: -SUGAR, sweets, anything with added sugar, corn syrup or sweeteners - must read labels as even foods advertised as "healthy" often are loaded with sugar -if you must have a sweetener, small amounts of stevia may be best -sweetened beverages and artificially sweetened beverages -simple starches (rice, bread, potatoes, pasta, chips, etc - small amounts of 100% whole grains are ok) -red meat, pork, butter -fried foods, fast food, processed food, excessive dairy, eggs and coconut.  3)Get at least 150 minutes of  sweaty aerobic exercise per week.  4)Reduce stress - consider counseling, meditation and relaxation to balance other aspects of your life.          Lucretia Kern, DO

## 2018-04-08 ENCOUNTER — Ambulatory Visit: Payer: 59 | Admitting: Family Medicine

## 2018-04-08 ENCOUNTER — Encounter: Payer: Self-pay | Admitting: Family Medicine

## 2018-04-08 VITALS — BP 116/80 | HR 88 | Temp 98.3°F | Ht 68.0 in | Wt 256.8 lb

## 2018-04-08 DIAGNOSIS — I10 Essential (primary) hypertension: Secondary | ICD-10-CM | POA: Diagnosis not present

## 2018-04-08 DIAGNOSIS — D500 Iron deficiency anemia secondary to blood loss (chronic): Secondary | ICD-10-CM | POA: Diagnosis not present

## 2018-04-08 DIAGNOSIS — Z23 Encounter for immunization: Secondary | ICD-10-CM

## 2018-04-08 LAB — BASIC METABOLIC PANEL
BUN: 11 mg/dL (ref 6–23)
CO2: 29 meq/L (ref 19–32)
Calcium: 10 mg/dL (ref 8.4–10.5)
Chloride: 102 mEq/L (ref 96–112)
Creatinine, Ser: 0.79 mg/dL (ref 0.40–1.20)
GFR: 98.55 mL/min (ref >60.00)
GLUCOSE: 87 mg/dL (ref 70–99)
POTASSIUM: 4.1 meq/L (ref 3.5–5.1)
Sodium: 139 mEq/L (ref 135–145)

## 2018-04-08 LAB — CBC
HEMATOCRIT: 41.8 % (ref 36.0–46.0)
Hemoglobin: 14.1 g/dL (ref 12.0–15.0)
MCHC: 33.7 g/dL (ref 30.0–36.0)
MCV: 87.6 fl (ref 78.0–100.0)
Platelets: 267 10*3/uL (ref 150.0–400.0)
RBC: 4.77 Mil/uL (ref 3.87–5.11)
RDW: 13 % (ref 11.5–15.5)
WBC: 3.4 10*3/uL — ABNORMAL LOW (ref 4.0–10.5)

## 2018-04-08 NOTE — Addendum Note (Signed)
Addended by: Agnes Lawrence on: 04/08/2018 07:44 AM   Modules accepted: Orders

## 2018-04-08 NOTE — Patient Instructions (Signed)
BEFORE YOU LEAVE: -labs -flu shot -follow up: CPE in 3 months  Get your mammogram every year.  Please loose 20 lbs over the next 3-6 months.  We have ordered labs or studies at this visit. It can take up to 1-2 weeks for results and processing. IF results require follow up or explanation, we will call you with instructions. Clinically stable results will be released to your [facility]. If you have not heard from [place] or cannot find your results in [facility] in 2 weeks please contact our office at [phone].  If you are not yet signed up for [facility], please consider signing up.   We recommend the following healthy lifestyle for LIFE: 1) Small portions. But, make sure to get regular (at least 3 per day), healthy meals and small healthy snacks if needed.  2) Eat a healthy clean diet.   TRY TO EAT: -at least 5-7 servings of low sugar, colorful, and nutrient rich vegetables per day (not corn, potatoes or bananas.) -berries are the best choice if you wish to eat fruit (only eat small amounts if trying to reduce weight)  -lean meets (fish, white meat of chicken or [place]) -vegan proteins for some meals - beans or tofu, whole grains, nuts and seeds -Replace bad fats with good fats - good fats include: fish, nuts and seeds, canola oil, olive oil -small amounts of low fat or non fat dairy -small amounts of100 % whole grains - check the lables -drink plenty of water  AVOID: -SUGAR, sweets, anything with added sugar, corn syrup or sweeteners - must read labels as even foods advertised as "healthy" often are loaded with sugar -if you must have a sweetener, small amounts of stevia may be best -sweetened beverages and artificially sweetened beverages -simple starches (rice, bread, potatoes, pasta, chips, etc - small amounts of 100% whole grains are ok) -red meat, pork, butter -fried foods, fast food, processed food, excessive dairy, eggs and coconut.  3)Get at least 150 minutes of sweaty aerobic  exercise per week.  4)Reduce stress - consider counseling, meditation and relaxation to balance other aspects of your life.

## 2018-04-30 ENCOUNTER — Other Ambulatory Visit: Payer: Self-pay | Admitting: *Deleted

## 2018-04-30 MED ORDER — AMLODIPINE BESYLATE 5 MG PO TABS: ORAL_TABLET | ORAL | 5 refills | Status: DC

## 2018-04-30 NOTE — Telephone Encounter (Signed)
Rx done. 

## 2018-06-28 ENCOUNTER — Telehealth: Payer: Self-pay | Admitting: *Deleted

## 2018-06-28 NOTE — Telephone Encounter (Signed)
I called the pt and informed her she can schedule a visit via Webex with Dr Maudie Mercury this month and also schedule a new patient visit with Dr Volanda Napoleon, Dr [place], Dr Ethlyn Gallery or Dr Jerilee Hoh. Patient cancelled the appt for 4/9 and stated she will call back later as she would like an actual visit in the office.

## 2018-06-28 NOTE — Telephone Encounter (Signed)
Copied from [place] [id number]. Topic: General - Inquiry >> Jun 24, 2018  4:40 PM Reyne Dumas L wrote: Reason for CRM:   Pt called and left voicemail on Jay - only identified herself, but states she is checking on her husbands upcoming appointment.  Pt also states that they received a letter that Dr. Maudie Mercury was leaving and want to know what to do.  Pt states she can be reached at [phone].

## 2018-07-08 ENCOUNTER — Encounter: Payer: 59 | Admitting: Family Medicine

## 2018-10-18 ENCOUNTER — Other Ambulatory Visit: Payer: Self-pay | Admitting: Family Medicine

## 2018-10-21 NOTE — Telephone Encounter (Signed)
Relation to pt: self  Call back number: [phone]  Pharmacy:  [facility], [place] [phone] (Phone) [phone] (Fax)     Reason for call:  Patient checking on the status amLODipine (NORVASC) 5 MG tablet , please advise

## 2018-10-27 ENCOUNTER — Other Ambulatory Visit: Payer: Self-pay

## 2018-10-27 ENCOUNTER — Ambulatory Visit (INDEPENDENT_AMBULATORY_CARE_PROVIDER_SITE_OTHER): Payer: 59 | Admitting: Internal Medicine

## 2018-10-27 DIAGNOSIS — D500 Iron deficiency anemia secondary to blood loss (chronic): Secondary | ICD-10-CM

## 2018-10-27 DIAGNOSIS — D259 Leiomyoma of uterus, unspecified: Secondary | ICD-10-CM | POA: Diagnosis not present

## 2018-10-27 DIAGNOSIS — I10 Essential (primary) hypertension: Secondary | ICD-10-CM

## 2018-10-27 MED ORDER — AMLODIPINE BESYLATE 5 MG PO TABS: 7.5000 mg | ORAL_TABLET | Freq: Every day | ORAL | 3 refills | Status: DC

## 2018-10-27 NOTE — Progress Notes (Signed)
Virtual Visit via Telephone Note  I connected with Olivia West on 10/27/18 at  3:30 PM EDT by telephone and verified that I am speaking with the correct person using two identifiers.   I discussed the limitations, risks, security and privacy concerns of performing an evaluation and management service by telephone and the availability of in person appointments. I also discussed with the patient that there may be a patient responsible charge related to this service. The patient expressed understanding and agreed to proceed.  Location patient: home Location provider: work office Participants present for the call: patient, provider Patient did not have a visit in the prior 7 days to address this/these issue(s).   History of Present Illness:  This is a scheduled visit to establish care and to discuss chronic medical conditions.  She is transferring care from Dr. Maudie Mercury.  Her past medical history significant for:  1.  Hypertension that has been well controlled on 7.5 mg of Norvasc.  She needs refills of her BP meds today.  2.  Uterine leiomyoma that is followed by GYN.  3.  Iron deficiency anemia on ferrous sulfate supplementation.  4.  Obesity who was last weight 257 pounds in the office.  She has no acute complaints today    Observations/Objective: Patient sounds cheerful and well on the phone. I do not appreciate any increased work of breathing. Speech and thought processing are grossly intact. Patient reported vitals: None reported   Current Outpatient Medications:  .  amLODipine (NORVASC) 5 MG tablet, Take 1.5 tablets (7.5 mg total) by mouth daily., Disp: 45 tablet, Rfl: 3 .  Ferrous Sulfate (FEROSUL PO), Take 1 tsp by mouth every morning , Disp: , Rfl:  .  Multiple Vitamin (MULTIVITAMIN) capsule, Take 1 capsule by mouth daily., Disp: , Rfl:   Review of Systems:  Constitutional: Denies fever, chills, diaphoresis, appetite change and fatigue.  HEENT: Denies photophobia,  eye pain, redness, hearing loss, ear pain, congestion, sore throat, rhinorrhea, sneezing, mouth sores, trouble swallowing, neck pain, neck stiffness and tinnitus.   Respiratory: Denies SOB, DOE, cough, chest tightness,  and wheezing.   Cardiovascular: Denies chest pain, palpitations and leg swelling.  Gastrointestinal: Denies nausea, vomiting, abdominal pain, diarrhea, constipation, blood in stool and abdominal distention.  Genitourinary: Denies dysuria, urgency, frequency, hematuria, flank pain and difficulty urinating.  Endocrine: Denies: hot or cold intolerance, sweats, changes in hair or nails, polyuria, polydipsia. Musculoskeletal: Denies myalgias, back pain, joint swelling, arthralgias and gait problem.  Skin: Denies pallor, rash and wound.  Neurological: Denies dizziness, seizures, syncope, weakness, light-headedness, numbness and headaches.  Hematological: Denies adenopathy. Easy bruising, personal or family bleeding history  Psychiatric/Behavioral: Denies suicidal ideation, mood changes, confusion, nervousness, sleep disturbance and agitation   Assessment and Plan:  Essential hypertension  -Has been well controlled in the past. -Has not had a chance to check blood pressure since last office visit. -Is requesting Norvasc refills today.  Iron deficiency anemia due to chronic blood loss -Continue ferrous sulfate, check hemoglobin and blood counts at next in-office visit  Uterine leiomyoma, unspecified location -Followed by GYN, now that she is perimenopausal, it is not causing significant bleeding.    I discussed the assessment and treatment plan with the patient. The patient was provided an opportunity to ask questions and all were answered. The patient agreed with the plan and demonstrated an understanding of the instructions.   The patient was advised to call back or seek an in-person evaluation if the symptoms worsen  or if the condition fails to improve as anticipated.  I  provided 22 minutes of non-face-to-face time during this encounter.   Lelon Frohlich, MD [facility] at [facility]

## 2018-11-23 ENCOUNTER — Other Ambulatory Visit: Payer: Self-pay | Admitting: Family Medicine

## 2018-11-23 DIAGNOSIS — I10 Essential (primary) hypertension: Secondary | ICD-10-CM

## 2018-12-08 LAB — HM MAMMOGRAPHY

## 2018-12-13 ENCOUNTER — Other Ambulatory Visit: Payer: Self-pay | Admitting: *Deleted

## 2018-12-13 DIAGNOSIS — Z20822 Contact with and (suspected) exposure to covid-19: Secondary | ICD-10-CM

## 2018-12-14 ENCOUNTER — Other Ambulatory Visit: Payer: Self-pay

## 2018-12-14 DIAGNOSIS — Z20822 Contact with and (suspected) exposure to covid-19: Secondary | ICD-10-CM

## 2018-12-14 LAB — NOVEL CORONAVIRUS, NAA: SARS-CoV-2, NAA: NOT DETECTED

## 2018-12-16 LAB — NOVEL CORONAVIRUS, NAA: SARS-CoV-2, NAA: NOT DETECTED

## 2019-03-16 ENCOUNTER — Other Ambulatory Visit: Payer: Self-pay | Admitting: Internal Medicine

## 2019-03-16 DIAGNOSIS — I10 Essential (primary) hypertension: Secondary | ICD-10-CM

## 2019-03-31 ENCOUNTER — Encounter: Payer: Self-pay | Admitting: Internal Medicine

## 2019-04-13 ENCOUNTER — Other Ambulatory Visit: Payer: Self-pay | Admitting: Internal Medicine

## 2019-04-13 DIAGNOSIS — I10 Essential (primary) hypertension: Secondary | ICD-10-CM

## 2019-06-22 ENCOUNTER — Encounter: Payer: Self-pay | Admitting: Internal Medicine

## 2019-10-09 ENCOUNTER — Other Ambulatory Visit: Payer: Self-pay | Admitting: Internal Medicine

## 2019-10-09 DIAGNOSIS — I10 Essential (primary) hypertension: Secondary | ICD-10-CM

## 2019-11-07 ENCOUNTER — Other Ambulatory Visit: Payer: Self-pay | Admitting: Internal Medicine

## 2019-11-07 DIAGNOSIS — I10 Essential (primary) hypertension: Secondary | ICD-10-CM

## 2019-12-09 ENCOUNTER — Other Ambulatory Visit: Payer: Self-pay | Admitting: Internal Medicine

## 2019-12-09 DIAGNOSIS — I10 Essential (primary) hypertension: Secondary | ICD-10-CM

## 2019-12-15 ENCOUNTER — Other Ambulatory Visit: Payer: Self-pay

## 2019-12-15 ENCOUNTER — Telehealth: Payer: Self-pay | Admitting: Internal Medicine

## 2019-12-15 NOTE — Telephone Encounter (Signed)
error 

## 2019-12-16 ENCOUNTER — Encounter: Payer: Self-pay | Admitting: Internal Medicine

## 2019-12-16 ENCOUNTER — Ambulatory Visit: Payer: 59 | Admitting: Internal Medicine

## 2019-12-16 DIAGNOSIS — Z23 Encounter for immunization: Secondary | ICD-10-CM

## 2019-12-16 DIAGNOSIS — D259 Leiomyoma of uterus, unspecified: Secondary | ICD-10-CM

## 2019-12-16 DIAGNOSIS — I10 Essential (primary) hypertension: Secondary | ICD-10-CM | POA: Diagnosis not present

## 2019-12-16 DIAGNOSIS — D500 Iron deficiency anemia secondary to blood loss (chronic): Secondary | ICD-10-CM

## 2019-12-16 MED ORDER — AMLODIPINE BESYLATE 10 MG PO TABS: 10.0000 mg | ORAL_TABLET | Freq: Every day | ORAL | 1 refills | Status: DC

## 2019-12-16 NOTE — Progress Notes (Signed)
Established Patient Office Visit     This visit occurred during the SARS-CoV-2 public health emergency.  Safety protocols were in place, including screening questions prior to the visit, additional usage of staff PPE, and extensive cleaning of exam room while observing appropriate contact time as indicated for disinfecting solutions.    CC/Reason for Visit: Follow-up chronic medical conditions  HPI: Olivia West is a 54 y.o. female who is coming in today for the above mentioned reasons. Past Medical History is significant for:   1.  Hypertension.  2.  Uterine leiomyoma that is followed by GYN.  3.  Iron deficiency anemia on ferrous sulfate supplementation.  4.  Obesity.  She has no acute complaints today. She is overdue for physical exam and lab work. She is requesting flu vaccine today.   Past Medical/Surgical History: Past Medical History:  Diagnosis Date  . Class 2 obesity due to excess calories with serious comorbidity and body mass index (BMI) of 38.0 to 38.9 in adult 02/08/2016  . Fracture of spine, cervical, without spinal cord injury, closed ([place]) 05/31/2011  . Hypertension   . Iron deficiency anemia due to chronic blood loss 06/06/2016  . Neck injury   . Uterine leiomyoma 06/06/2016    Past Surgical History:  Procedure Laterality Date  . POSTERIOR CERVICAL FUSION/FORAMINOTOMY  06/01/2011   Procedure: POSTERIOR CERVICAL FUSION/FORAMINOTOMY LEVEL 1;  Surgeon: Winfield Cunas, MD;  Location: [place] [facility];  Service: Neurosurgery;  Laterality: N/A;  Posterior Cervical Fusion Cervical Six-Seven  . UTERINE FIBROID SURGERY     for heavy menstrual bleeding    Social History:  reports that she has never smoked. She has never used smokeless tobacco. She reports that she does not drink alcohol and does not use drugs.  Allergies: Allergies  Allergen Reactions  . Hydrochlorothiazide Shortness Of Breath and Palpitations    Family History:  Family History  Problem  Relation Age of Onset  . Breast cancer Sister 4       died in 06-26-08   . Hypertension Mother        all of family     Current Outpatient Medications:  .  amLODipine (NORVASC) 10 MG tablet, Take 1 tablet (10 mg total) by mouth daily., Disp: 90 tablet, Rfl: 1 .  Ferrous Sulfate (FEROSUL PO), Take 1 tsp by mouth every morning , Disp: , Rfl:  .  Multiple Vitamin (MULTIVITAMIN) capsule, Take 1 capsule by mouth daily., Disp: , Rfl:   Review of Systems:  Constitutional: Denies fever, chills, diaphoresis, appetite change and fatigue.  HEENT: Denies photophobia, eye pain, redness, hearing loss, ear pain, congestion, sore throat, rhinorrhea, sneezing, mouth sores, trouble swallowing, neck pain, neck stiffness and tinnitus.   Respiratory: Denies SOB, DOE, cough, chest tightness,  and wheezing.   Cardiovascular: Denies chest pain, palpitations and leg swelling.  Gastrointestinal: Denies nausea, vomiting, abdominal pain, diarrhea, constipation, blood in stool and abdominal distention.  Genitourinary: Denies dysuria, urgency, frequency, hematuria, flank pain and difficulty urinating.  Endocrine: Denies: hot or cold intolerance, sweats, changes in hair or nails, polyuria, polydipsia. Musculoskeletal: Denies myalgias, back pain, joint swelling, arthralgias and gait problem.  Skin: Denies pallor, rash and wound.  Neurological: Denies dizziness, seizures, syncope, weakness, light-headedness, numbness and headaches.  Hematological: Denies adenopathy. Easy bruising, personal or family bleeding history  Psychiatric/Behavioral: Denies suicidal ideation, mood changes, confusion, nervousness, sleep disturbance and agitation    Physical Exam: Vitals:   12/16/19 1138  BP: (!) 140/96  Pulse:  90  Temp: 98.9 F (37.2 C)  TempSrc: Oral  SpO2: 97%  Weight: 268 lb 3.2 oz (121.7 kg)    Body mass index is 40.78 kg/m.   Constitutional: NAD, calm, comfortable Eyes: PERRL, lids and conjunctivae normal, wears  corrective lenses ENMT: Mucous membranes are moist.  Respiratory: clear to auscultation bilaterally, no wheezing, no crackles. Normal respiratory effort. No accessory muscle use.  Cardiovascular: Regular rate and rhythm, no murmurs / rubs / gallops. No extremity edema. Neurologic: Grossly intact and nonfocal Psychiatric: Normal judgment and insight. Alert and oriented x 3. Normal mood.    Impression and Plan:  Morbid obesity ([place]) -Discussed healthy lifestyle, including increased physical activity and better food choices to promote weight loss.  Iron deficiency anemia due to chronic blood loss  - Plan: CBC with Differential/Platelet  Essential hypertension  -Blood pressure has been uncontrolled. -Increase amlodipine to 10 mg and have her return in 6 weeks for follow-up.    Patient Instructions  -Nice seeing you today!!  -Lab work today; will notify you once results are available.  -Flu vaccine today.  -Increase amlodipine (norvasc) to 5 mg daily.  -Schedule follow up in 6 weeks.     Lelon Frohlich, MD [facility] at [facility]

## 2019-12-16 NOTE — Patient Instructions (Signed)
-  Nice seeing you today!!  -Lab work today; will notify you once results are available.  -Flu vaccine today.  -Increase amlodipine (norvasc) to 5 mg daily.  -Schedule follow up in 6 weeks.

## 2019-12-17 LAB — COMPREHENSIVE METABOLIC PANEL
AG Ratio: 1.1 (calc) (ref 1.0–2.5)
ALT: 15 U/L (ref 6–29)
AST: 15 U/L (ref 10–35)
Albumin: 4 g/dL (ref 3.6–5.1)
Alkaline phosphatase (APISO): 70 U/L (ref 37–153)
BUN: 12 mg/dL (ref 7–25)
CO2: 26 mmol/L (ref 20–32)
Calcium: 9.8 mg/dL (ref 8.6–10.4)
Chloride: 103 mmol/L (ref 98–110)
Creat: 0.83 mg/dL (ref 0.50–1.05)
Globulin: 3.5 g/dL (calc) (ref 1.9–3.7)
Glucose, Bld: 90 mg/dL (ref 65–99)
Potassium: 3.9 mmol/L (ref 3.5–5.3)
Sodium: 139 mmol/L (ref 135–146)
Total Bilirubin: 0.5 mg/dL (ref 0.2–1.2)
Total Protein: 7.5 g/dL (ref 6.1–8.1)

## 2019-12-17 LAB — CBC WITH DIFFERENTIAL/PLATELET
Absolute Monocytes: 302 cells/uL (ref 200–950)
Basophils Absolute: 21 cells/uL (ref 0–200)
Basophils Relative: 0.5 %
Eosinophils Absolute: 252 cells/uL (ref 15–500)
Eosinophils Relative: 6 %
HCT: 42 % (ref 35.0–45.0)
Hemoglobin: 14 g/dL (ref 11.7–15.5)
Lymphs Abs: 1760 cells/uL (ref 850–3900)
MCH: 28.9 pg (ref 27.0–33.0)
MCHC: 33.3 g/dL (ref 32.0–36.0)
MCV: 86.6 fL (ref 80.0–100.0)
MPV: 10.7 fL (ref 7.5–12.5)
Monocytes Relative: 7.2 %
Neutro Abs: 1865 cells/uL (ref 1500–7800)
Neutrophils Relative %: 44.4 %
Platelets: 293 10*3/uL (ref 140–400)
RBC: 4.85 10*6/uL (ref 3.80–5.10)
RDW: 12.8 % (ref 11.0–15.0)
Total Lymphocyte: 41.9 %
WBC: 4.2 10*3/uL (ref 3.8–10.8)

## 2019-12-17 LAB — LIPID PANEL
Cholesterol: 145 mg/dL (ref <200)
HDL: 56 mg/dL (ref >=50)
LDL Cholesterol (Calc): 72 mg/dL (calc)
Non-HDL Cholesterol (Calc): 89 mg/dL (calc) (ref <130)
Total CHOL/HDL Ratio: 2.6 (calc) (ref <5.0)
Triglycerides: 90 mg/dL (ref <150)

## 2020-01-14 LAB — HM MAMMOGRAPHY

## 2020-01-19 ENCOUNTER — Encounter: Payer: Self-pay | Admitting: Internal Medicine

## 2020-02-10 ENCOUNTER — Ambulatory Visit: Payer: 59 | Admitting: Internal Medicine

## 2020-03-09 ENCOUNTER — Ambulatory Visit: Payer: 59 | Admitting: Internal Medicine

## 2020-04-02 ENCOUNTER — Telehealth (INDEPENDENT_AMBULATORY_CARE_PROVIDER_SITE_OTHER): Payer: 59 | Admitting: Family Medicine

## 2020-04-02 ENCOUNTER — Encounter: Payer: Self-pay | Admitting: Internal Medicine

## 2020-04-02 DIAGNOSIS — Z20822 Contact with and (suspected) exposure to covid-19: Secondary | ICD-10-CM

## 2020-04-02 DIAGNOSIS — J029 Acute pharyngitis, unspecified: Secondary | ICD-10-CM | POA: Diagnosis not present

## 2020-04-02 DIAGNOSIS — R059 Cough, unspecified: Secondary | ICD-10-CM

## 2020-04-02 MED ORDER — BENZONATATE 100 MG PO CAPS: 100.0000 mg | ORAL_CAPSULE | Freq: Three times a day (TID) | ORAL | 0 refills | Status: DC | PRN

## 2020-04-02 NOTE — Patient Instructions (Addendum)
  HOME CARE TIPS:  -[place] COVID19 testing information: [email or web address] [place] [phone] Most pharmacies also offer testing and home test kits.  -I sent the medication(s) we discussed to your pharmacy: Meds ordered this encounter  Medications  . benzonatate (TESSALON PERLES) 100 MG capsule    Sig: Take 1 capsule (100 mg total) by mouth 3 (three) times daily as needed.    Dispense:  20 capsule    Refill:  0     -can use tylenol or aleve if needed for fevers, aches and pains per instructions  -can use nasal saline a few times per day if nasal congestion  -stay hydrated, drink plenty of fluids and eat small healthy meals - avoid dairy  -can take 1000 IU Vit D3 and Vit C lozenges per instructions  -If the Covid test is positive, check out the CDC website for more information on home care, transmission and treatment for COVID19  -follow up with your doctor in 2-3 days unless improving and feeling better  -stay home while sick, except to seek medical care, and if you have COVID19 please stay home for a full 10 days since the onset of symptoms PLUS one day of no fever and feeling better.  It was nice to meet you today, and I really hope you are feeling better soon. I help [facility] out with telemedicine visits on Tuesdays and Thursdays and am available for visits on those days. If you have any concerns or questions following this visit please schedule a follow up visit with your Primary Care doctor or seek care at a local urgent care clinic to avoid delays in care.    Seek in person care promptly if your symptoms worsen, new concerns arise or you are not improving with treatment. Call 911 and/or seek emergency care if you symptoms are severe or life threatening.

## 2020-04-02 NOTE — Telephone Encounter (Signed)
Spoke with the pt and scheduled a virtual visit today at 4pm with Dr Selena Batten as PCP is out of the office.  I offered an appt for the pts husband and she declined as he is feeling some better at this time and will call back if needed.  Also cancelled the appt with the PCP for Wednesday due to symptoms.

## 2020-04-02 NOTE — Progress Notes (Signed)
Virtual Visit via Video Note  I connected with Olivia West  on 04/02/20 at  4:00 PM EST by a video enabled telemedicine application and verified that I am speaking with the correct person using two identifiers.  Location patient: home, [place] Location provider:work or home office Persons participating in the virtual visit: patient, provider  I discussed the limitations of evaluation and management by telemedicine and the availability of in person appointments. The patient expressed understanding and agreed to proceed.   HPI:  Acute telemedicine visit for a cough and sore throat/covid exposure in home: -Onset: started 03/31/20 - got sick from her sister-in-law (she and her kids were both at her house last week and they tested positive for covid) -Symptoms include:subjective fever, cough, sore throat, sinus congestion -Denies: CP, SOB, NVD, malaise, inability to get out of bed/eat/drink -Has tried: tylenol -Pertinent past medical history: HTN, Obesity -Pertinent medication allergies:hctz -COVID-19 vaccine status: fully vaccinated  ROS: See pertinent positives and negatives per HPI.  Past Medical History:  Diagnosis Date  . Class 2 obesity due to excess calories with serious comorbidity and body mass index (BMI) of 38.0 to 38.9 in adult 02/08/2016  . Fracture of spine, cervical, without spinal cord injury, closed (HCC) 05/31/2011  . Hypertension   . Iron deficiency anemia due to chronic blood loss 06/06/2016  . Neck injury   . Uterine leiomyoma 06/06/2016    Past Surgical History:  Procedure Laterality Date  . POSTERIOR CERVICAL FUSION/FORAMINOTOMY  06/01/2011   Procedure: POSTERIOR CERVICAL FUSION/FORAMINOTOMY LEVEL 1;  Surgeon: Carmela Hurt, MD;  Location: [facility];  Service: Neurosurgery;  Laterality: N/A;  Posterior Cervical Fusion Cervical Six-Seven  . UTERINE FIBROID SURGERY     for heavy menstrual bleeding     Current Outpatient Medications:  .  amLODipine (NORVASC) 10 MG tablet,  Take 1 tablet (10 mg total) by mouth daily., Disp: 90 tablet, Rfl: 1 .  Ferrous Sulfate (FEROSUL PO), Take 1 tsp by mouth every morning , Disp: , Rfl:  .  Multiple Vitamin (MULTIVITAMIN) capsule, Take 1 capsule by mouth daily., Disp: , Rfl:   EXAM:  VITALS per patient if applicable:  GENERAL: alert, oriented, appears well and in no acute distress  HEENT: atraumatic, conjunttiva clear, no obvious abnormalities on inspection of external nose and ears  NECK: normal movements of the head and neck  LUNGS: on inspection no signs of respiratory distress, breathing rate appears normal, no obvious gross SOB, gasping or wheezing  CV: no obvious cyanosis  MS: moves all visible extremities without noticeable abnormality  PSYCH/NEURO: pleasant and cooperative, no obvious depression or anxiety, speech and thought processing grossly intact  ASSESSMENT AND PLAN:  Discussed the following assessment and plan:  Close exposure to COVID-19 virus  Cough  Sore throat  -we discussed possible serious and likely etiologies, options for evaluation and workup, limitations of telemedicine visit vs in person visit, treatment, treatment risks and precautions. Pt prefers to treat via telemedicine empirically rather than in person at this moment.  Covid is most likely given close exposure in the home over the last week and now with classic mild symptoms.  She has opted for symptomatic care with a cough medicine, sent Tessalon prescription to her pharmacy.  We also discussed warm liquids, nasal saline, analgesics and other home care tips, summarized in patient instructions.  Discussed treatment options, potential complications, precautions and isolation.  Advised to seek prompt in person care if worsening, new symptoms arise, or if is not improving  with treatment. Discussed options for inperson care if PCP office not available. Did let this patient know that I only do telemedicine on Tuesdays and Thursdays for  [place]. Advised to schedule follow up visit with PCP or [facility] if any further questions or concerns to avoid delays in care.   I discussed the assessment and treatment plan with the patient. The patient was provided an opportunity to ask questions and all were answered. The patient agreed with the plan and demonstrated an understanding of the instructions.     Olivia Kern, DO

## 2020-04-04 ENCOUNTER — Ambulatory Visit: Payer: 59 | Admitting: Internal Medicine

## 2020-06-15 ENCOUNTER — Other Ambulatory Visit: Payer: Self-pay | Admitting: Internal Medicine

## 2020-06-15 DIAGNOSIS — I10 Essential (primary) hypertension: Secondary | ICD-10-CM

## 2021-01-28 ENCOUNTER — Other Ambulatory Visit: Payer: Self-pay | Admitting: Internal Medicine

## 2021-01-28 DIAGNOSIS — I10 Essential (primary) hypertension: Secondary | ICD-10-CM

## 2021-01-28 LAB — HM MAMMOGRAPHY

## 2021-03-07 ENCOUNTER — Encounter: Payer: Self-pay | Admitting: Internal Medicine

## 2021-05-09 ENCOUNTER — Telehealth: Payer: Self-pay | Admitting: Internal Medicine

## 2021-05-09 DIAGNOSIS — I10 Essential (primary) hypertension: Secondary | ICD-10-CM

## 2021-05-09 MED ORDER — AMLODIPINE BESYLATE 10 MG PO TABS: 10.0000 mg | ORAL_TABLET | Freq: Every day | ORAL | 0 refills | Status: DC

## 2021-05-09 NOTE — Telephone Encounter (Signed)
Patient called in stating that the pharmacy advised her to call the office to request a refill for amLODipine (NORVASC) 10 MG tablet [[id number]] .  Patient was last seen in the office in 11/2019. Informed patient that she may have to schedule an office visit in order to get medication since it's been so long.  Patient could be contacted at [phone].  Please advise.

## 2021-05-10 ENCOUNTER — Telehealth: Payer: Self-pay | Admitting: Internal Medicine

## 2021-05-10 DIAGNOSIS — I10 Essential (primary) hypertension: Secondary | ICD-10-CM

## 2021-05-10 MED ORDER — AMLODIPINE BESYLATE 10 MG PO TABS: 10.0000 mg | ORAL_TABLET | Freq: Every day | ORAL | 0 refills | Status: DC

## 2021-05-10 NOTE — Telephone Encounter (Signed)
Rx sent 

## 2021-05-10 NOTE — Telephone Encounter (Signed)
Patient needs Amlodipine reset to a different pharmacy.  Please take [facility] out of her chart.  [facility] [address].  She needs this as her primary pharmacy in her chart.   --pt will be out on Monday 05/13/21

## 2021-06-10 ENCOUNTER — Ambulatory Visit: Payer: 59 | Admitting: Internal Medicine

## 2021-06-10 ENCOUNTER — Encounter: Payer: Self-pay | Admitting: Internal Medicine

## 2021-06-10 VITALS — BP 130/84 | HR 90 | Temp 98.5°F | Ht 66.0 in | Wt 271.7 lb

## 2021-06-10 DIAGNOSIS — Z0000 Encounter for general adult medical examination without abnormal findings: Secondary | ICD-10-CM

## 2021-06-10 DIAGNOSIS — Z23 Encounter for immunization: Secondary | ICD-10-CM

## 2021-06-10 DIAGNOSIS — D259 Leiomyoma of uterus, unspecified: Secondary | ICD-10-CM

## 2021-06-10 DIAGNOSIS — D500 Iron deficiency anemia secondary to blood loss (chronic): Secondary | ICD-10-CM

## 2021-06-10 DIAGNOSIS — I10 Essential (primary) hypertension: Secondary | ICD-10-CM

## 2021-06-10 MED ORDER — FUROSEMIDE 20 MG PO TABS: 20.0000 mg | ORAL_TABLET | Freq: Every day | ORAL | 1 refills | Status: AC

## 2021-06-10 NOTE — Addendum Note (Signed)
Addended by: Westley Hummer B on: 06/10/2021 02:55 PM ? ? Modules accepted: Orders ? ?

## 2021-06-10 NOTE — Patient Instructions (Signed)
-  Nice seeing you today!! ? ?-return fasting for labs. ? ?-Flu and first shingles vaccines today. ? ?-Start lasix 20 mg daily. ? ?-Schedule follow up in 6 weeks. ? ? ?

## 2021-06-10 NOTE — Progress Notes (Signed)
? ? ? ?Established Patient Office Visit ? ? ? ? ?This visit occurred during the SARS-CoV-2 public health emergency.  Safety protocols were in place, including screening questions prior to the visit, additional usage of staff PPE, and extensive cleaning of exam room while observing appropriate contact time as indicated for disinfecting solutions.  ? ? ?CC/Reason for Visit: Annual preventive exam, follow-up chronic medical conditions ? ?HPI: Olivia West is a 57 y.o. female who is coming in today for the above mentioned reasons. Past Medical History is significant for: Hypertension, morbid obesity, iron deficiency anemia and a history of uterine leiomyoma.  She has not seen GYN in years.  She had a mammogram in 2020-06-29 and a colonoscopy in 06/29/16.  She is not fasting and will return for labs.  She is due for flu, Tdap and shingles vaccinations.  She received her COVID booster last month.  2 separate in office blood pressure measurements are 130/84 and 140/96.  Her blood pressure was also 140/90 during her last office visit.  She has been on amlodipine 10 mg for years.  She has significant bilateral lower extremity edema. ? ? ?Past Medical/Surgical History: ?Past Medical History:  ?Diagnosis Date  ? Class 2 obesity due to excess calories with serious comorbidity and body mass index (BMI) of 38.0 to 38.9 in adult 02/08/2016  ? Fracture of spine, cervical, without spinal cord injury, closed ([place]) 05/31/2011  ? Hypertension   ? Iron deficiency anemia due to chronic blood loss 06/06/2016  ? Neck injury   ? Uterine leiomyoma 06/06/2016  ? ? ?Past Surgical History:  ?Procedure Laterality Date  ? POSTERIOR CERVICAL FUSION/FORAMINOTOMY  06/01/2011  ? Procedure: POSTERIOR CERVICAL FUSION/FORAMINOTOMY LEVEL 1;  Surgeon: Winfield Cunas, MD;  Location: [facility];  Service: Neurosurgery;  Laterality: N/A;  Posterior Cervical Fusion Cervical Six-Seven  ? UTERINE FIBROID SURGERY    ? for heavy menstrual bleeding  ? ? ?Social History: ?  reports that she has never smoked. She has never used smokeless tobacco. She reports that she does not drink alcohol and does not use drugs. ? ?Allergies: ?Allergies  ?Allergen Reactions  ? Hydrochlorothiazide Shortness Of Breath and Palpitations  ? ? ?Family History:  ?Family History  ?Problem Relation Age of Onset  ? Breast cancer Sister 40  ?     died in 2008/06/29   ? Hypertension Mother   ?     all of family  ? ? ? ?Current Outpatient Medications:  ?  amLODipine (NORVASC) 10 MG tablet, Take 1 tablet (10 mg total) by mouth daily., Disp: 30 tablet, Rfl: 0 ?  furosemide (LASIX) 20 MG tablet, Take 1 tablet (20 mg total) by mouth daily., Disp: 90 tablet, Rfl: 1 ?  Multiple Vitamin (MULTIVITAMIN) capsule, Take 1 capsule by mouth daily., Disp: , Rfl:  ? ?Review of Systems:  ?Constitutional: Denies fever, chills, diaphoresis, appetite change and fatigue.  ?HEENT: Denies photophobia, eye pain, redness, hearing loss, ear pain, congestion, sore throat, rhinorrhea, sneezing, mouth sores, trouble swallowing, neck pain, neck stiffness and tinnitus.   ?Respiratory: Denies SOB, DOE, cough, chest tightness,  and wheezing.   ?Cardiovascular: Denies chest pain, palpitations. ?Gastrointestinal: Denies nausea, vomiting, abdominal pain, diarrhea, constipation, blood in stool and abdominal distention.  ?Genitourinary: Denies dysuria, urgency, frequency, hematuria, flank pain and difficulty urinating.  ?Endocrine: Denies: hot or cold intolerance, sweats, changes in hair or nails, polyuria, polydipsia. ?Musculoskeletal: Denies myalgias, back pain, joint swelling, arthralgias and gait problem.  ?Skin: Denies pallor, rash  and wound.  ?Neurological: Denies dizziness, seizures, syncope, weakness, light-headedness, numbness and headaches.  ?Hematological: Denies adenopathy. Easy bruising, personal or family bleeding history  ?Psychiatric/Behavioral: Denies suicidal ideation, mood changes, confusion, nervousness, sleep disturbance and  agitation ? ? ? ?Physical Exam: ?Vitals:  ? 06/10/21 1402  ?BP: 130/84  ?Pulse: 90  ?Temp: 98.5 ?F (36.9 ?C)  ?TempSrc: Oral  ?SpO2: 98%  ?Weight: 271 lb 11.2 oz (123.2 kg)  ?Height: '5\' 6"'$  (1.676 m)  ? ? ?Body mass index is 43.85 kg/m?. ? ? ?Constitutional: NAD, calm, comfortable ?Eyes: PERRL, lids and conjunctivae normal, wears corrective lenses ?ENMT: Mucous membranes are moist. Posterior pharynx clear of any exudate or lesions. Normal dentition. Tympanic membrane is pearly white, no erythema or bulging. ?Neck: normal, supple, no masses, no thyromegaly ?Respiratory: clear to auscultation bilaterally, no wheezing, no crackles. Normal respiratory effort. No accessory muscle use.  ?Cardiovascular: Regular rate and rhythm, no murmurs / rubs / gallops. No extremity edema. 2+ pedal pulses. No carotid bruits.  ?Abdomen: no tenderness, no masses palpated. No hepatosplenomegaly. Bowel sounds positive.  ?Musculoskeletal: no clubbing / cyanosis. No joint deformity upper and lower extremities. Good ROM, no contractures. Normal muscle tone.  ?Skin: no rashes, lesions, ulcers. No induration ?Neurologic: CN 2-12 grossly intact. Sensation intact, DTR normal. Strength 5/5 in all 4.  ?Psychiatric: Normal judgment and insight. Alert and oriented x 3. Normal mood.  ? ? ?Impression and Plan: ? ?Encounter for preventive health examination ?-Recommend routine eye and dental care. ?-Immunizations: Flu and for shingles vaccine today.  Still due for Tdap. ?-Healthy lifestyle discussed in detail. ?-Labs to be updated today. ?-Colon cancer screening: 05/2016, 10-year callback ?-Breast cancer screening: 12/2020 ?-Cervical cancer screening: Overdue, she will schedule GYN appointment ?-Lung cancer screening: Not applicable ?-Prostate cancer screening: Not applicable ?-DEXA: Not applicable ? ?Needs flu shot ? - Plan: Flu Vaccine QUAD 6+ mos PF IM (Fluarix Quad PF) ? ?Essential hypertension ? - Plan: CBC with Differential/Platelet, Comprehensive  metabolic panel, Lipid panel, furosemide (LASIX) 20 MG tablet ?-Blood pressure remains above goal, given that and lower extremity edema will order 2D echocardiogram (? CHF) and will start on Lasix 20 mg daily.  She has a documented allergy to hydrochlorothiazide. ?-She will return in 6 weeks for follow-up. ? ?Iron deficiency anemia due to chronic blood loss  ?- Plan: Ferritin, CBC ? ?Morbid obesity ([place])  ?- Plan: Hemoglobin A1c, VITAMIN D 25 Hydroxy (Vit-D Deficiency, Fractures), Vitamin B12, TSH ?-Discussed healthy lifestyle, including increased physical activity and better food choices to promote weight loss. ? ?Uterine leiomyoma, unspecified location ?-Advised follow-up with GYN. ? ?Need for shingles vaccine ?-For shingles vaccine administered today. ? ? ? ?Patient Instructions  ?-Nice seeing you today!! ? ?-return fasting for labs. ? ?-Flu and first shingles vaccines today. ? ?-Start lasix 20 mg daily. ? ?-Schedule follow up in 6 weeks. ? ? ? ? ? ?Lelon Frohlich, MD ?[facility] ? ? ?

## 2021-06-18 ENCOUNTER — Other Ambulatory Visit (INDEPENDENT_AMBULATORY_CARE_PROVIDER_SITE_OTHER): Payer: 59

## 2021-06-18 DIAGNOSIS — D500 Iron deficiency anemia secondary to blood loss (chronic): Secondary | ICD-10-CM | POA: Diagnosis not present

## 2021-06-18 DIAGNOSIS — I10 Essential (primary) hypertension: Secondary | ICD-10-CM | POA: Diagnosis not present

## 2021-06-18 LAB — CBC WITH DIFFERENTIAL/PLATELET
Basophils Absolute: 0 10*3/uL (ref 0.0–0.1)
Basophils Relative: 0.8 % (ref 0.0–3.0)
Eosinophils Absolute: 0.2 10*3/uL (ref 0.0–0.7)
Eosinophils Relative: 5.7 % — ABNORMAL HIGH (ref 0.0–5.0)
HCT: 39.7 % (ref 36.0–46.0)
Hemoglobin: 13.2 g/dL (ref 12.0–15.0)
Lymphocytes Relative: 47.2 % — ABNORMAL HIGH (ref 12.0–46.0)
Lymphs Abs: 1.8 10*3/uL (ref 0.7–4.0)
MCHC: 33.2 g/dL (ref 30.0–36.0)
MCV: 85.3 fl (ref 78.0–100.0)
Monocytes Absolute: 0.3 10*3/uL (ref 0.1–1.0)
Monocytes Relative: 6.7 % (ref 3.0–12.0)
Neutro Abs: 1.5 10*3/uL (ref 1.4–7.7)
Neutrophils Relative %: 39.6 % — ABNORMAL LOW (ref 43.0–77.0)
Platelets: 288 10*3/uL (ref 150.0–400.0)
RBC: 4.66 Mil/uL (ref 3.87–5.11)
RDW: 13 % (ref 11.5–15.5)
WBC: 3.9 10*3/uL — ABNORMAL LOW (ref 4.0–10.5)

## 2021-06-18 LAB — COMPREHENSIVE METABOLIC PANEL
ALT: 16 U/L (ref 0–35)
AST: 18 U/L (ref 0–37)
Albumin: 3.9 g/dL (ref 3.5–5.2)
Alkaline Phosphatase: 65 U/L (ref 39–117)
BUN: 11 mg/dL (ref 6–23)
CO2: 26 mEq/L (ref 19–32)
Calcium: 9.4 mg/dL (ref 8.4–10.5)
Chloride: 105 mEq/L (ref 96–112)
Creatinine, Ser: 0.73 mg/dL (ref 0.40–1.20)
GFR: 93.18 mL/min (ref >60.00)
Glucose, Bld: 92 mg/dL (ref 70–99)
Potassium: 3.8 mEq/L (ref 3.5–5.1)
Sodium: 138 mEq/L (ref 135–145)
Total Bilirubin: 0.4 mg/dL (ref 0.2–1.2)
Total Protein: 7.6 g/dL (ref 6.0–8.3)

## 2021-06-18 LAB — LIPID PANEL
Cholesterol: 137 mg/dL (ref 0–200)
HDL: 56.5 mg/dL (ref >39.00)
LDL Cholesterol: 64 mg/dL (ref 0–99)
NonHDL: 80.68
Total CHOL/HDL Ratio: 2
Triglycerides: 82 mg/dL (ref 0.0–149.0)
VLDL: 16.4 mg/dL (ref 0.0–40.0)

## 2021-06-18 LAB — VITAMIN D 25 HYDROXY (VIT D DEFICIENCY, FRACTURES): VITD: 31.74 ng/mL (ref 30.00–100.00)

## 2021-06-18 LAB — HEMOGLOBIN A1C: Hgb A1c MFr Bld: 5.9 % (ref 4.6–6.5)

## 2021-06-18 LAB — VITAMIN B12: Vitamin B-12: 678 pg/mL (ref 211–911)

## 2021-06-18 LAB — TSH: TSH: 1.28 u[IU]/mL (ref 0.35–5.50)

## 2021-06-18 LAB — FERRITIN: Ferritin: 179.6 ng/mL (ref 10.0–291.0)

## 2021-07-22 ENCOUNTER — Ambulatory Visit: Payer: 59 | Admitting: Internal Medicine

## 2021-08-05 ENCOUNTER — Ambulatory Visit: Payer: 59 | Admitting: Internal Medicine

## 2021-08-05 ENCOUNTER — Encounter: Payer: Self-pay | Admitting: Internal Medicine

## 2021-08-05 DIAGNOSIS — I10 Essential (primary) hypertension: Secondary | ICD-10-CM | POA: Diagnosis not present

## 2021-08-05 MED ORDER — AMLODIPINE BESYLATE 10 MG PO TABS: 10.0000 mg | ORAL_TABLET | Freq: Every day | ORAL | 0 refills | Status: DC

## 2021-08-05 NOTE — Progress Notes (Signed)
? ? ? ?Established Patient Office Visit ? ? ? ? ?CC/Reason for Visit: Follow-up blood pressure ? ?HPI: Olivia West is a 56 y.o. female who is coming in today for the above mentioned reasons. Past Medical History is significant for: Obesity, hypertension, iron deficiency anemia.  She was last seen on March 13.  At that time she was noticed to have significant lower extremity edema and elevated blood pressure.  She was prescribed 20 mg of Lasix and scheduled for an echocardiogram which has not yet happened.  She has not been doing ambulatory blood pressure monitoring. ? ? ?Past Medical/Surgical History: ?Past Medical History:  ?Diagnosis Date  ? Class 2 obesity due to excess calories with serious comorbidity and body mass index (BMI) of 38.0 to 38.9 in adult 02/08/2016  ? Fracture of spine, cervical, without spinal cord injury, closed ([place]) 05/31/2011  ? Hypertension   ? Iron deficiency anemia due to chronic blood loss 06/06/2016  ? Neck injury   ? Uterine leiomyoma 06/06/2016  ? ? ?Past Surgical History:  ?Procedure Laterality Date  ? POSTERIOR CERVICAL FUSION/FORAMINOTOMY  06/01/2011  ? Procedure: POSTERIOR CERVICAL FUSION/FORAMINOTOMY LEVEL 1;  Surgeon: Winfield Cunas, MD;  Location: [facility];  Service: Neurosurgery;  Laterality: N/A;  Posterior Cervical Fusion Cervical Six-Seven  ? UTERINE FIBROID SURGERY    ? for heavy menstrual bleeding  ? ? ?Social History: ? reports that she has never smoked. She has never used smokeless tobacco. She reports that she does not drink alcohol and does not use drugs. ? ?Allergies: ?Allergies  ?Allergen Reactions  ? Hydrochlorothiazide Shortness Of Breath and Palpitations  ? ? ?Family History:  ?Family History  ?Problem Relation Age of Onset  ? Breast cancer Sister 67  ?     died in 06-23-2008   ? Hypertension Mother   ?     all of family  ? ? ? ?Current Outpatient Medications:  ?  furosemide (LASIX) 20 MG tablet, Take 1 tablet (20 mg total) by mouth daily., Disp: 90 tablet, Rfl: 1 ?   Multiple Vitamin (MULTIVITAMIN) capsule, Take 1 capsule by mouth daily., Disp: , Rfl:  ?  amLODipine (NORVASC) 10 MG tablet, Take 1 tablet (10 mg total) by mouth daily., Disp: 30 tablet, Rfl: 0 ? ?Review of Systems:  ?Constitutional: Denies fever, chills, diaphoresis, appetite change and fatigue.  ?HEENT: Denies photophobia, eye pain, redness, hearing loss, ear pain, congestion, sore throat, rhinorrhea, sneezing, mouth sores, trouble swallowing, neck pain, neck stiffness and tinnitus.   ?Respiratory: Denies SOB, DOE, cough, chest tightness,  and wheezing.   ?Cardiovascular: Denies chest pain, palpitations and leg swelling.  ?Gastrointestinal: Denies nausea, vomiting, abdominal pain, diarrhea, constipation, blood in stool and abdominal distention.  ?Genitourinary: Denies dysuria, urgency, frequency, hematuria, flank pain and difficulty urinating.  ?Endocrine: Denies: hot or cold intolerance, sweats, changes in hair or nails, polyuria, polydipsia. ?Musculoskeletal: Denies myalgias, back pain, joint swelling, arthralgias and gait problem.  ?Skin: Denies pallor, rash and wound.  ?Neurological: Denies dizziness, seizures, syncope, weakness, light-headedness, numbness and headaches.  ?Hematological: Denies adenopathy. Easy bruising, personal or family bleeding history  ?Psychiatric/Behavioral: Denies suicidal ideation, mood changes, confusion, nervousness, sleep disturbance and agitation ? ? ? ?Physical Exam: ?Vitals:  ? 08/05/21 1333  ?BP: 136/88  ?Pulse: 88  ?Temp: 97.8 ?F (36.6 ?C)  ?TempSrc: Oral  ?SpO2: 97%  ?Weight: 266 lb (120.7 kg)  ?Height: '5\' 6"'$  (1.676 m)  ? ? ?Body mass index is 42.93 kg/m?. ? ? ?Constitutional: NAD,  calm, comfortable ?Eyes: PERRL, lids and conjunctivae normal, wears corrective lenses ?ENMT: Mucous membranes are moist.  ?Respiratory: clear to auscultation bilaterally, no wheezing, no crackles. Normal respiratory effort. No accessory muscle use.  ?Cardiovascular: Regular rate and rhythm, no  murmurs / rubs / gallops.  Trace bilateral edema ?Neurologic: Grossly intact and nonfocal ?Psychiatric: Normal judgment and insight. Alert and oriented x 3. Normal mood.  ? ? ?Impression and Plan: ? ?Essential hypertension - Plan: amLODipine (NORVASC) 10 MG tablet ?-Fair blood pressure control today, continue amlodipine and refills provided today, advised to use Lasix only as needed instead of daily.  I will check on the status of her echocardiogram. ? ? ?Time spent:22 minutes reviewing chart, interviewing and examining patient and formulating plan of care. ? ? ?Patient Instructions  ?-Nice seeing you today!! ? ?-Take lasix only as needed for swelling instead of daily. ? ?-Schedule follow up in 3 months. ? ? ?Lelon Frohlich, MD ?[facility] ? ? ?

## 2021-08-05 NOTE — Patient Instructions (Signed)
-  Nice seeing you today!! ? ?-Take lasix only as needed for swelling instead of daily. ? ?-Schedule follow up in 3 months. ?

## 2021-11-14 ENCOUNTER — Other Ambulatory Visit: Payer: Self-pay | Admitting: Internal Medicine

## 2021-11-14 DIAGNOSIS — I10 Essential (primary) hypertension: Secondary | ICD-10-CM

## 2022-04-29 ENCOUNTER — Telehealth: Payer: Self-pay | Admitting: Internal Medicine

## 2022-04-29 NOTE — Telephone Encounter (Signed)
Pt had mammo at [facility] and  pt  viewed her results and per pt it looks like she needs more imaging etc and was told it should come from her md to order

## 2022-04-30 NOTE — Telephone Encounter (Signed)
Spoke with the patient and she stated that it was the right breast. Orders faxed and confirmed

## 2022-11-18 ENCOUNTER — Other Ambulatory Visit: Payer: Self-pay | Admitting: Internal Medicine

## 2022-11-18 DIAGNOSIS — I10 Essential (primary) hypertension: Secondary | ICD-10-CM

## 2023-02-13 ENCOUNTER — Other Ambulatory Visit: Payer: Self-pay | Admitting: Internal Medicine

## 2023-02-13 DIAGNOSIS — I10 Essential (primary) hypertension: Secondary | ICD-10-CM

## 2023-03-13 ENCOUNTER — Other Ambulatory Visit: Payer: Self-pay | Admitting: Internal Medicine

## 2023-03-13 DIAGNOSIS — I10 Essential (primary) hypertension: Secondary | ICD-10-CM

## 2023-04-07 ENCOUNTER — Other Ambulatory Visit: Payer: Self-pay | Admitting: Internal Medicine

## 2023-04-07 DIAGNOSIS — I10 Essential (primary) hypertension: Secondary | ICD-10-CM

## 2023-04-10 ENCOUNTER — Other Ambulatory Visit: Payer: Self-pay | Admitting: Internal Medicine

## 2023-04-10 DIAGNOSIS — I10 Essential (primary) hypertension: Secondary | ICD-10-CM

## 2023-05-08 ENCOUNTER — Other Ambulatory Visit: Payer: Self-pay | Admitting: Internal Medicine

## 2023-05-08 DIAGNOSIS — I10 Essential (primary) hypertension: Secondary | ICD-10-CM

## 2023-08-11 ENCOUNTER — Other Ambulatory Visit: Payer: Self-pay | Admitting: Internal Medicine

## 2023-08-11 DIAGNOSIS — I10 Essential (primary) hypertension: Secondary | ICD-10-CM

## 2023-11-05 ENCOUNTER — Other Ambulatory Visit: Payer: Self-pay | Admitting: Internal Medicine

## 2023-11-05 DIAGNOSIS — I10 Essential (primary) hypertension: Secondary | ICD-10-CM

## 2024-01-04 ENCOUNTER — Other Ambulatory Visit: Payer: Self-pay | Admitting: Internal Medicine

## 2024-01-04 DIAGNOSIS — I10 Essential (primary) hypertension: Secondary | ICD-10-CM

## 2024-01-04 MED ORDER — AMLODIPINE BESYLATE 10 MG PO TABS
10.0000 mg | ORAL_TABLET | Freq: Every day | ORAL | 0 refills | Status: AC
Start: 2024-01-04 — End: ?

## 2024-01-04 NOTE — Telephone Encounter (Signed)
 Copied from CRM [id number]. Topic: Clinical - Medication Refill >> Jan 04, 2024  9:57 AM Viola F wrote: Medication: amLODipine  (NORVASC ) 10 MG tablet [[id number]]  Has the patient contacted their pharmacy? Yes (Agent: If no, request that the patient contact the pharmacy for the refill. If patient does not wish to contact the pharmacy document the reason why and proceed with request.) (Agent: If yes, when and what did the pharmacy advise?)  This is the patient's preferred pharmacy:  [id number] - [address] [place] [address] Phone: [phone] Fax: [phone]  Is this the correct pharmacy for this prescription? Yes If no, delete pharmacy and type the correct one.   Has the prescription been filled recently? Yes  Is the patient out of the medication? No, 4-5 pills   Has the patient been seen for an appointment in the last year OR does the patient have an upcoming appointment? Yes  Can we respond through MyChart? Yes  Agent: Please be advised that Rx refills may take up to 3 business days. We ask that you follow-up with your pharmacy.

## 2024-04-25 ENCOUNTER — Encounter: Payer: Self-pay | Admitting: Internal Medicine
